# Patient Record
Sex: Male | Born: 1955 | Race: White | Hispanic: No | Marital: Married | Smoking: Current every day smoker
Health system: Southern US, Community
[De-identification: ages and names within clinical notes are randomized; demographics above are authoritative.]

## PROBLEM LIST (undated history)

## (undated) DIAGNOSIS — J439 Emphysema, unspecified: Secondary | ICD-10-CM

## (undated) DIAGNOSIS — E785 Hyperlipidemia, unspecified: Secondary | ICD-10-CM

## (undated) DIAGNOSIS — J309 Allergic rhinitis, unspecified: Secondary | ICD-10-CM

## (undated) HISTORY — DX: Emphysema, unspecified: J43.9

## (undated) HISTORY — PX: LEG SURGERY: SHX1003

## (undated) HISTORY — DX: Hyperlipidemia, unspecified: E78.5

## (undated) HISTORY — DX: Allergic rhinitis, unspecified: J30.9

---

## 2015-06-13 ENCOUNTER — Telehealth: Payer: Self-pay | Admitting: Emergency Medicine

## 2015-06-13 NOTE — Telephone Encounter (Signed)
Spoke with pt. He has been scheduled to see RB on 07/11/15 at 9am. Nothing further was needed.

## 2015-06-13 NOTE — Telephone Encounter (Signed)
new pt Consult  Received: Yesterday    Collene Gobble, MD  Randa Spike, CMA Cc: Collene Gobble, MD           Called by Dr gage at [facility]:  Need Pulm consult visit for Enki Masley [birth year], [phone]   Hx tobacco and COPD, recent PNA, now persistent RUL cavitary lesion by CT and PET done at [facility]. Needs OV and possibly an FOB.     lmtcb x1 for pt to make appointment.

## 2015-07-11 ENCOUNTER — Encounter: Payer: Self-pay | Admitting: Emergency Medicine

## 2015-07-11 ENCOUNTER — Ambulatory Visit (INDEPENDENT_AMBULATORY_CARE_PROVIDER_SITE_OTHER): Payer: BC Managed Care – PPO | Admitting: Emergency Medicine

## 2015-07-11 VITALS — BP 104/80 | HR 86 | Ht 73.0 in | Wt 192.0 lb

## 2015-07-11 DIAGNOSIS — J449 Chronic obstructive pulmonary disease, unspecified: Secondary | ICD-10-CM

## 2015-07-11 DIAGNOSIS — Z720 Tobacco use: Secondary | ICD-10-CM | POA: Insufficient documentation

## 2015-07-11 DIAGNOSIS — J984 Other disorders of lung: Secondary | ICD-10-CM

## 2015-07-11 NOTE — Patient Instructions (Signed)
We will plan for bronchoscopy to biopsy your right lung lesion.  Please continue your Advair and Spiriva as you are using them.  Take albuterol 2 puffs up to every 4 hours if needed for shortness of breath.  Follow with Dr Lamonte Sakai next available after your procedure to review the results.

## 2015-07-11 NOTE — Assessment & Plan Note (Signed)
Continue Spiriva, Advair, pro-air for now.

## 2015-07-11 NOTE — Assessment & Plan Note (Signed)
High suspicion for primary lung malignancy. We will arrange for bronchoscopy with transbronchial biopsies on 07/12/15. Further evaluation depending on these results

## 2015-07-11 NOTE — Assessment & Plan Note (Signed)
Need to encourage tobacco cessation

## 2015-07-11 NOTE — Progress Notes (Signed)
Subjective:    Patient ID: Melvin White, male    DOB: 12-17-1955, 60 y.o.   MRN: [id number]  HPI 60 year old active smoker (45 pack years), with a history of allergic rhinitis, hypertension. Carries a diagnosis of COPD. Currently treated with Spiriva, Advair, pro-air when necessary. He takes loratadine D. He has 2-3 months of progressive SOB and fever. Also some progression of sputum. Was treated for possible PNA, imaged. No hemoptysis. He has been evaluated for an abnormality on CXR and Chest CT at [facility] that I personally reviewed:  CT chest  05/30/15 > Show severe upper lobe predominant emphysema, speculated right upper lobe mass measuring 10 x 3 x 4.4 cm with some areas of necrotic change, also noted was some proximal esophageal thickening, some right lower lobe groundglass opacity of unclear significance. PET 06/12/15>  Significant hypermetabolism of the right upper lobe cavitary mass with an SUV of 11.3. Tiny clustered right mid lobe nodules with mild hypermetabolic activity no evidence of distant disease in the abdomen or bony skeleton.           Review of Systems  Constitutional: Negative for fever and unexpected weight change.  HENT: Negative for congestion, dental problem, ear pain, nosebleeds, postnasal drip, rhinorrhea, sinus pressure, sneezing, sore throat and trouble swallowing.   Eyes: Negative for redness and itching.  Respiratory: Positive for cough. Negative for chest tightness, shortness of breath and wheezing.   Cardiovascular: Negative for palpitations and leg swelling.  Gastrointestinal: Negative for nausea and vomiting.  Genitourinary: Negative for dysuria.  Musculoskeletal: Negative for joint swelling.  Skin: Negative for rash.  Neurological: Negative for headaches.  Hematological: Does not bruise/bleed easily.  Psychiatric/Behavioral: Negative for dysphoric mood. The patient is not nervous/anxious.     Past Medical History  Diagnosis Date  . 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 Hyperlipidemia   . Allergic rhinitis      Family History  Problem Relation Age of Onset  . Lung cancer Father      Social History   Social History  . Marital Status: Married    Spouse Name: N/A  . Number of Children: N/A  . Years of Education: N/A   Occupational History  . Not on file.   Social History Main Topics  . Smoking status: Current Every Day Smoker -- 1.00 packs/day for 45 years    Types: Cigarettes  . Smokeless tobacco: Never Used  . Alcohol Use: No  . Drug Use: No  . Sexual Activity: Not on file   Other Topics Concern  . Not on file   Social History Narrative  . No narrative on file     No Known Allergies   No outpatient prescriptions prior to visit.   No facility-administered medications prior to visit.         Objective:   Physical Exam Filed Vitals:   07/11/15 0901  BP: 104/80  Pulse: 86  Height: 6\' 1"  (1.854 m)  Weight: 192 lb (87.091 kg)  SpO2: 96%   Gen: Pleasant, well-nourished, in no distress,  normal affect  ENT: No lesions,  mouth clear,  oropharynx clear, no postnasal drip  Neck: No JVD, no TMG, no carotid bruits  Lungs: No use of accessory muscles, clear without rales or rhonchi  Cardiovascular: RRR, heart sounds normal, no murmur or gallops, no peripheral edema  Musculoskeletal: No deformities, no cyanosis or clubbing  Neuro: alert, non focal  Skin: Warm, no lesions or rashes  Assessment & Plan:  Cavitating mass in right upper lung lobe High suspicion for primary lung malignancy. We will arrange for bronchoscopy with transbronchial biopsies on 07/12/15. Further evaluation depending on these results  Tobacco abuse Need to encourage tobacco cessation  COPD (chronic obstructive pulmonary disease) (HCC) Continue Spiriva, Advair, pro-air for now.   Baltazar Apo, MD, PhD 07/11/2015, 9:35 AM [facility] [phone] or if no answer [phone]

## 2015-07-12 ENCOUNTER — Ambulatory Visit (HOSPITAL_COMMUNITY): Payer: BC Managed Care – PPO

## 2015-07-12 ENCOUNTER — Ambulatory Visit (HOSPITAL_COMMUNITY)
Admission: RE | Admit: 2015-07-12 | Discharge: 2015-07-12 | Disposition: A | Discharge status: Home / Self Care | Payer: BC Managed Care – PPO | Source: Ambulatory Visit | Attending: Emergency Medicine | Admitting: Emergency Medicine

## 2015-07-12 ENCOUNTER — Encounter (HOSPITAL_COMMUNITY)
Admission: RE | Disposition: A | Discharge status: Home / Self Care | Payer: Self-pay | Source: Ambulatory Visit | Attending: Emergency Medicine

## 2015-07-12 DIAGNOSIS — Z9889 Other specified postprocedural states: Secondary | ICD-10-CM

## 2015-07-12 DIAGNOSIS — D1431 Benign neoplasm of right bronchus and lung: Secondary | ICD-10-CM | POA: Diagnosis not present

## 2015-07-12 DIAGNOSIS — R918 Other nonspecific abnormal finding of lung field: Secondary | ICD-10-CM | POA: Diagnosis present

## 2015-07-12 DIAGNOSIS — J984 Other disorders of lung: Secondary | ICD-10-CM

## 2015-07-12 HISTORY — PX: VIDEO BRONCHOSCOPY: SHX5072

## 2015-07-12 SURGERY — BRONCHOSCOPY, WITH FLUOROSCOPY
Anesthesia: Moderate Sedation | Laterality: Bilateral

## 2015-07-12 MED ORDER — LIDOCAINE HCL (PF) 1 % IJ SOLN
INTRAMUSCULAR | Status: DC | PRN
  Administered 2015-07-12: 6 mL

## 2015-07-12 MED ORDER — MIDAZOLAM HCL 10 MG/2ML IJ SOLN
INTRAMUSCULAR | Status: DC | PRN
  Administered 2015-07-12: 1 mg via INTRAVENOUS
  Administered 2015-07-12 (×3): 2 mg via INTRAVENOUS

## 2015-07-12 MED ORDER — LIDOCAINE HCL 2 % EX GEL
CUTANEOUS | Status: DC | PRN
  Administered 2015-07-12: 1

## 2015-07-12 MED ORDER — MIDAZOLAM HCL 5 MG/ML IJ SOLN
INTRAMUSCULAR | Status: AC
  Filled 2015-07-12: qty 2

## 2015-07-12 MED ORDER — SODIUM CHLORIDE 0.9 % IV SOLN
INTRAVENOUS | Status: DC
  Administered 2015-07-12: 10:00:00 via INTRAVENOUS

## 2015-07-12 MED ORDER — PHENYLEPHRINE HCL 0.25 % NA SOLN
NASAL | Status: DC | PRN
  Administered 2015-07-12: 2 via NASAL

## 2015-07-12 MED ORDER — FENTANYL CITRATE (PF) 100 MCG/2ML IJ SOLN
INTRAMUSCULAR | Status: DC | PRN
  Administered 2015-07-12: 75 ug via INTRAVENOUS
  Administered 2015-07-12: 50 ug via INTRAVENOUS
  Administered 2015-07-12 (×2): 25 ug via INTRAVENOUS

## 2015-07-12 MED ORDER — FENTANYL CITRATE (PF) 100 MCG/2ML IJ SOLN
INTRAMUSCULAR | Status: AC
  Filled 2015-07-12: qty 4

## 2015-07-12 NOTE — Interval H&P Note (Signed)
PCCM Interval Note  Pt with hx tobacco, new RUL spiculated mass that has not resolved with abx, hypermetabolic on PET scan. He presents now for bronchoscopy and biopsies to attempt tissue dx. No new issues reported. All questions regarding the procedure answered.   Filed Vitals:   07/12/15 0935 07/12/15 0940 07/12/15 0945 07/12/15 0950  BP: 151/96 148/77 152/87 139/86  Pulse: 94 91 94 96  Temp:      Resp: 22 18 23 20   Height:      Weight:      SpO2: 94% 95% 95% 96%   Plans: will proceed with video FOB and biopsies this am   Baltazar Apo, MD, PhD 07/12/2015, 9:58 AM [facility] [phone] or if no answer [phone]

## 2015-07-12 NOTE — H&P (View-Only) (Signed)
Subjective:    Patient ID: Melvin White, male    DOB: May 02, 1955, 60 y.o.   MRN: [id number]  HPI 60 year old active smoker (45 pack years), with a history of allergic rhinitis, hypertension. Carries a diagnosis of COPD. Currently treated with Spiriva, Advair, pro-air when necessary. He takes loratadine D. He has 2-3 months of progressive SOB and fever. Also some progression of sputum. Was treated for possible PNA, imaged. No hemoptysis. He has been evaluated for an abnormality on CXR and Chest CT at [facility] that I personally reviewed:  CT chest  05/30/15 > Show severe upper lobe predominant emphysema, speculated right upper lobe mass measuring 10 x 3 x 4.4 cm with some areas of necrotic change, also noted was some proximal esophageal thickening, some right lower lobe groundglass opacity of unclear significance. PET 06/12/15>  Significant hypermetabolism of the right upper lobe cavitary mass with an SUV of 11.3. Tiny clustered right mid lobe nodules with mild hypermetabolic activity no evidence of distant disease in the abdomen or bony skeleton.           Review of Systems  Constitutional: Negative for fever and unexpected weight change.  HENT: Negative for congestion, dental problem, ear pain, nosebleeds, postnasal drip, rhinorrhea, sinus pressure, sneezing, sore throat and trouble swallowing.   Eyes: Negative for redness and itching.  Respiratory: Positive for cough. Negative for chest tightness, shortness of breath and wheezing.   Cardiovascular: Negative for palpitations and leg swelling.  Gastrointestinal: Negative for nausea and vomiting.  Genitourinary: Negative for dysuria.  Musculoskeletal: Negative for joint swelling.  Skin: Negative for rash.  Neurological: Negative for headaches.  Hematological: Does not bruise/bleed easily.  Psychiatric/Behavioral: Negative for dysphoric mood. The patient is not nervous/anxious.     Past Medical History  Diagnosis Date  . 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 Hyperlipidemia   . Allergic rhinitis      Family History  Problem Relation Age of Onset  . Lung cancer Father      Social History   Social History  . Marital Status: Married    Spouse Name: N/A  . Number of Children: N/A  . Years of Education: N/A   Occupational History  . Not on file.   Social History Main Topics  . Smoking status: Current Every Day Smoker -- 1.00 packs/day for 45 years    Types: Cigarettes  . Smokeless tobacco: Never Used  . Alcohol Use: No  . Drug Use: No  . Sexual Activity: Not on file   Other Topics Concern  . Not on file   Social History Narrative  . No narrative on file     No Known Allergies   No outpatient prescriptions prior to visit.   No facility-administered medications prior to visit.         Objective:   Physical Exam Filed Vitals:   07/11/15 0901  BP: 104/80  Pulse: 86  Height: 6\' 1"  (1.854 m)  Weight: 192 lb (87.091 kg)  SpO2: 96%   Gen: Pleasant, well-nourished, in no distress,  normal affect  ENT: No lesions,  mouth clear,  oropharynx clear, no postnasal drip  Neck: No JVD, no TMG, no carotid bruits  Lungs: No use of accessory muscles, clear without rales or rhonchi  Cardiovascular: RRR, heart sounds normal, no murmur or gallops, no peripheral edema  Musculoskeletal: No deformities, no cyanosis or clubbing  Neuro: alert, non focal  Skin: Warm, no lesions or rashes  Assessment & Plan:  Cavitating mass in right upper lung lobe High suspicion for primary lung malignancy. We will arrange for bronchoscopy with transbronchial biopsies on 07/12/15. Further evaluation depending on these results  Tobacco abuse Need to encourage tobacco cessation  COPD (chronic obstructive pulmonary disease) (HCC) Continue Spiriva, Advair, pro-air for now.   Baltazar Apo, MD, PhD 07/11/2015, 9:35 AM [facility] [phone] or if no answer [phone]

## 2015-07-12 NOTE — Discharge Instructions (Signed)
Flexible Bronchoscopy, Care After °These instructions give you information on caring for yourself after your procedure. Your doctor may also give you more specific instructions. Call your doctor if you have any problems or questions after your procedure. °HOME CARE °· Do not eat or drink anything for 2 hours after your procedure. If you try to eat or drink before the medicine wears off, food or drink could go into your lungs. You could also burn yourself. °· After 2 hours have passed and when you can cough and gag normally, you may eat soft food and drink liquids slowly. °· The day after the test, you may eat your normal diet. °· You may do your normal activities. °· Keep all doctor visits. °GET HELP RIGHT AWAY IF: °· You get more and more short of breath. °· You get light-headed. °· You feel like you are going to pass out (faint). °· You have chest pain. °· You have new problems that worry you. °· You cough up more than a little blood. °· You cough up more blood than before. °MAKE SURE YOU: °· Understand these instructions. °· Will watch your condition. °· Will get help right away if you are not doing well or get worse. °  °This information is not intended to replace advice given to you by your health care provider. Make sure you discuss any questions you have with your health care provider. ° °Please call our office for any questions or concerns. [phone].  °  °Document Released: 10/26/2008 Document Revised: 01/03/2013 Document Reviewed: 09/02/2012 °Elsevier Interactive Patient Education ©2016 [employer] Inc. ° °

## 2015-07-12 NOTE — Op Note (Signed)
[facility] Cardiopulmonary Patient Name: Melvin White Date: 07/12/2015 MRN: [id number] Attending MD: Collene Gobble , MD Date of Birth: 1955-08-06 CSN: Finalized Age: 60 Admit Type: Outpatient Gender: Male Procedure:            Bronchoscopy Indications:          Right upper lobe mass Providers:            Collene Gobble, MD, Tammie Readling RRT,RCP, Doris Cheadle RRT,RCP Referring MD:          Medicines:            Fentanyl 175 mcg IV, Midazolam 7 mg IV, Lidocaine 1%                        applied to cords 6 mL, Lidocaine 1% applied to the                        tracheobronchial tree 15 mL Complications:        No immediate complications Estimated Blood Loss: Estimated blood loss was minimal. Procedure:            Pre-Anesthesia Assessment:                       - A History and Physical has been performed. Patient                        meds and allergies have been reviewed. The risks and                        benefits of the procedure and the sedation options and                        risks were discussed with the patient. All questions                        were answered and informed consent was obtained.                        Patient identification and proposed procedure were                        verified prior to the procedure by the physician in the                        procedure room. Mental Status Examination: normal.                        Airway Examination: normal oropharyngeal airway.                        Respiratory Examination: poor air movement. CV                        Examination: normal. ASA Grade Assessment: II - A                        patient with mild systemic disease. After reviewing the  risks and benefits, the patient was deemed in                        satisfactory condition to undergo the procedure. The                        anesthesia plan was to use moderate sedation /                         analgesia (conscious sedation). Immediately prior to                        administration of medications, the patient was                        re-assessed for adequacy to receive sedatives. The                        heart rate, respiratory rate, oxygen saturations, blood                        pressure, adequacy of pulmonary ventilation, and                        response to care were monitored throughout the                        procedure. The physical status of the patient was                        re-assessed after the procedure.                       After obtaining informed consent, the bronchoscope was                        passed under direct vision. Throughout the procedure,                        the patient's blood pressure, pulse, and oxygen                        saturations were monitored continuously. the [id number]                        [id number] scope was introduced through the right nostril                        and advanced to the tracheobronchial tree of both                        lungs. The patient tolerated the procedure well. Scope In: 10:16:17 AM Scope Out: 10:40:00 AM Findings:      The nasopharynx/oropharynx appears normal. The larynx appears normal.       The vocal cords appear normal. The subglottic space is normal. The       trachea is of normal caliber. The carina is sharp. The tracheobronchial       tree was examined to at least the first subsegmental level. Bronchial       mucosa and anatomy are normal; there  are no endobronchial lesions, and       no secretions.      Transbronchial biopsies of a mass were performed in the apical segment       of the right upper lobe using forceps and sent for histopathology       examination. The procedure was guided by fluoroscopy. Six biopsy passes       were performed. Six biopsy samples were obtained.      Bronchoalveolar lavage was performed in the lung and sent for routine        cytology, aerobic culture, AFB analysis & culture, Nocardia, fungal       analysis and Actinomyces analysis. 60 mL of fluid were instilled. 10 mL       were returned. The return was blood-tinged. There were no mucoid plugs       in the return fluid.      Fluoroscopically guided transbronchial brushings of a mass were obtained       in the apical segment of the right upper lobe with a cytology brush and       sent for routine cytology. Five samples were obtained. Impression:           - Right upper lobe mass                       - The examination was normal.                       - Transbronchial lung biopsies were performed.                       - Bronchoalveolar lavage was performed.                       - Fluoroscopically guided transbronchial brushings were                        obtained. Moderate Sedation:      Moderate (conscious) sedation was personally administered by the       endoscopist. The following parameters were monitored: oxygen saturation,       heart rate, blood pressure, respiratory rate, adequacy of pulmonary       ventilation and response to care. Total physician intraservice time was       28 minutes. Recommendation:       - Await BAL, biopsy and brushing results.                       - Follow up with bronchoscopist as previously scheduled. Procedure Code(s):    --- Professional ---                       [phone], Bronchoscopy, rigid or flexible, including                        fluoroscopic guidance, when performed; with                        transbronchial lung biopsy(s), single lobe                       [id number], Bronchoscopy, rigid or flexible, including  fluoroscopic guidance, when performed; with brushing or                        protected brushings                       [id number], Moderate sedation services provided by the same                        physician or other qualified health care professional                        performing  the diagnostic or therapeutic service that                        the sedation supports, requiring the presence of an                        independent trained observer to assist in the                        monitoring of the patient's level of consciousness and                        physiological status; initial 15 minutes of                        intraservice time, patient age 53 years or older                       [phone], Moderate sedation services; each additional 15                        minutes intraservice time Diagnosis Code(s):    --- Professional ---                       R91.8, Other nonspecific abnormal finding of lung field CPT copyright 2016 American Medical Association. All rights reserved. The codes documented in this report are preliminary and upon coder review may  be revised to meet current compliance requirements. Collene Gobble, MD Collene Gobble, MD 07/12/2015 11:37:19 AM Number of Addenda: 0

## 2015-07-12 NOTE — Progress Notes (Signed)
Video bronchoscopy with washing intervention, biopsy intervention, brushing intervention. All vitals good thru out procedure. Report given to wife about drinking. Eating. Tylenol to be given.

## 2015-07-13 LAB — ACID FAST SMEAR (AFB, MYCOBACTERIA): Acid Fast Smear: NEGATIVE

## 2015-07-13 LAB — ACID FAST SMEAR (AFB)

## 2015-07-14 ENCOUNTER — Encounter (HOSPITAL_COMMUNITY): Payer: Self-pay | Admitting: Emergency Medicine

## 2015-07-14 LAB — CULTURE, BAL-QUANTITATIVE: CULTURE: NORMAL — AB

## 2015-07-14 LAB — CULTURE, BAL-QUANTITATIVE W GRAM STAIN

## 2015-07-15 ENCOUNTER — Telehealth: Payer: Self-pay | Admitting: Emergency Medicine

## 2015-07-15 NOTE — Telephone Encounter (Signed)
Pt states that he has not started his Aspirin yet -- pt has not taken this since before procedure. Pt has only taken Spiriva, Advair, Proair and Tylenol PRN since procedure.  Please advise Dr Melvyn Novas thanks.

## 2015-07-15 NOTE — Telephone Encounter (Signed)
Called made pt aware of recs. Nothing further needed 

## 2015-07-15 NOTE — Telephone Encounter (Signed)
Hold aspirin until no longer bleeding at all - this is not unusual

## 2015-07-15 NOTE — Telephone Encounter (Signed)
Then he's doing all that he needs for now - call if getting worse pending bx results and we'll go from there

## 2015-07-15 NOTE — Telephone Encounter (Signed)
Spoke with pt, since bronch on 07/12/15 has had a prod cough- mucus is clear with streaks of dark blood, with an occasional "glob" of blood about the size of a thumbnail coming up.  Pt states that the hemoptysis is less frequent than it was immediately after the procedure, but pt is concerned with how long he should be coughing up blood.  Denies fever, chest pain.  Pt uses [facility].    Sending to DOD as RB is on vacation.   MW please advise.  Thanks!   Patient Instructions       We will plan for bronchoscopy to biopsy your right lung lesion.   Please continue your Advair and Spiriva as you are using them.   Take albuterol 2 puffs up to every 4 hours if needed for shortness of breath.  Follow with Dr Lamonte Sakai next available after your procedure to review the results.

## 2015-07-18 ENCOUNTER — Telehealth: Payer: Self-pay | Admitting: Emergency Medicine

## 2015-07-18 DIAGNOSIS — R918 Other nonspecific abnormal finding of lung field: Secondary | ICD-10-CM

## 2015-07-18 NOTE — Telephone Encounter (Signed)
Received a call from [place]. He had me look at the pt's bronch cytology and pathology reports. Bronch was benign. Per RB >> pt needs to have a CT guided biopsy with IR.  Pt is aware of results and additional testing he will need. Order has been placed. Nothing further was needed.

## 2015-07-31 ENCOUNTER — Telehealth: Payer: Self-pay | Admitting: Emergency Medicine

## 2015-07-31 NOTE — Telephone Encounter (Signed)
Called pt to see whether he has been set up for IR procedure to assess his lung mass. No answer, left message. Will try him again or he will call us.

## 2015-08-06 NOTE — Telephone Encounter (Signed)
Please call Melvin White to see if / when he is to have biopsy performed. Thanks

## 2015-08-07 NOTE — Telephone Encounter (Signed)
lmtcb x1 for pt. 

## 2015-08-08 NOTE — Telephone Encounter (Signed)
lmtcb x2 for pt.  I spoke with [facility] about this order. She states that there is documentation that Dr. Almedia Balls tried to reach RB about this pt. That is why this has not been scheduled yet.

## 2015-08-08 NOTE — Telephone Encounter (Signed)
Please have the patient make an OV to see me asap and we will discuss the results and whether to schedule another biopsy at this time.

## 2015-08-08 NOTE — Telephone Encounter (Signed)
Called spoke with patient to discuss RB's recommendations Pt is not scheduled for another biopsy at this time but is already scheduled for ov for the next available appt on 8.8.17 Will sign and forward to RB as FYI

## 2015-08-14 LAB — FUNGUS CULTURE RESULT

## 2015-08-14 LAB — FUNGUS CULTURE WITH STAIN

## 2015-08-14 LAB — FUNGAL ORGANISM REFLEX

## 2015-08-20 ENCOUNTER — Ambulatory Visit (INDEPENDENT_AMBULATORY_CARE_PROVIDER_SITE_OTHER): Payer: BC Managed Care – PPO | Admitting: Emergency Medicine

## 2015-08-20 ENCOUNTER — Encounter: Payer: Self-pay | Admitting: Emergency Medicine

## 2015-08-20 ENCOUNTER — Encounter (INDEPENDENT_AMBULATORY_CARE_PROVIDER_SITE_OTHER): Payer: Self-pay

## 2015-08-20 VITALS — BP 110/62 | HR 84 | Ht 72.0 in | Wt 196.0 lb

## 2015-08-20 DIAGNOSIS — R918 Other nonspecific abnormal finding of lung field: Secondary | ICD-10-CM

## 2015-08-20 DIAGNOSIS — J449 Chronic obstructive pulmonary disease, unspecified: Secondary | ICD-10-CM

## 2015-08-20 DIAGNOSIS — J984 Other disorders of lung: Secondary | ICD-10-CM | POA: Diagnosis not present

## 2015-08-20 NOTE — Progress Notes (Signed)
Subjective:    Patient ID: Melvin White, male    DOB: 1955-09-24, 60 y.o.   MRN: [id number]  HPI 60 year old active smoker (45 pack years), with a history of allergic rhinitis, hypertension. Carries a diagnosis of COPD. Currently treated with Spiriva, Advair, pro-air when necessary. He takes loratadine D. He has 2-3 months of progressive SOB and fever. Also some progression of sputum. Was treated for possible PNA, imaged. No hemoptysis. He has been evaluated for an abnormality on CXR and Chest CT at [facility] that I personally reviewed:  CT chest  05/30/15 > Show severe upper lobe predominant emphysema, speculated right upper lobe mass measuring 10 x 3 x 4.4 cm with some areas of necrotic change, also noted was some proximal esophageal thickening, some right lower lobe groundglass opacity of unclear significance. PET 06/12/15>  Significant hypermetabolism of the right upper lobe cavitary mass with an SUV of 11.3. Tiny clustered right mid lobe nodules with mild hypermetabolic activity no evidence of distant disease in the abdomen or bony skeleton.   ROV 08/20/15 -- Follow-up visit for history of tobacco use, COPD and a spiculated right upper lobe mass with some areas of necrotic change, hypermetabolic on PET scan from 06/12/15. He underwent bronchoscopy on 07/12/15. Cytology and pathology were negative. Given my index of suspicion I referred him for needle biopsy in interventional radiology. This apparently has not yet been done.  He is actually doing very well. Denies any change in dyspnea or functional capacity. No weight loss.      Review of Systems  Constitutional: Negative for fever and unexpected weight change.  HENT: Negative for congestion, dental problem, ear pain, nosebleeds, postnasal drip, rhinorrhea, sinus pressure, sneezing, sore throat and trouble swallowing.   Eyes: Negative for redness and itching.  Respiratory: Positive for cough. Negative for chest tightness, shortness of breath and  wheezing.   Cardiovascular: Negative for palpitations and leg swelling.  Gastrointestinal: Negative for nausea and vomiting.  Genitourinary: Negative for dysuria.  Musculoskeletal: Negative for joint swelling.  Skin: Negative for rash.  Neurological: Negative for headaches.  Hematological: Does not bruise/bleed easily.  Psychiatric/Behavioral: Negative for dysphoric mood. The patient is not nervous/anxious.     Past Medical History:  Diagnosis Date  . Allergic rhinitis   . Emphysema of lung ([place])   . Hyperlipidemia      Family History  Problem Relation Age of Onset  . Lung cancer Father      Social History   Social History  . Marital status: Married    Spouse name: N/A  . Number of children: N/A  . Years of education: N/A   Occupational History  . Not on file.   Social History Main Topics  . Smoking status: Current Every Day Smoker    Packs/day: 1.00    Years: 45.00    Types: Cigarettes  . Smokeless tobacco: Never Used  . Alcohol use No  . Drug use: No  . Sexual activity: Not on file   Other Topics Concern  . Not on file   Social History Narrative  . No narrative on file     No Known Allergies   Outpatient Medications Prior to Visit  Medication Sig Dispense Refill  . albuterol (PROAIR HFA) 108 (90 Base) MCG/ACT inhaler Inhale 2 puffs into the lungs every 6 (six) hours as needed for wheezing or shortness of breath.    Marland Kitchen aspirin 81 MG tablet Take 81 mg by mouth daily.    . Cholecalciferol (VITAMIN D3)  5000 units CAPS Take 1 capsule by mouth daily.    . Coenzyme Q10 (COQ10) 200 MG CAPS Take 1 capsule by mouth daily.    . fluticasone-salmeterol (ADVAIR HFA) 230-21 MCG/ACT inhaler Inhale 2 puffs into the lungs 2 (two) times daily.    Marland Kitchen loratadine-pseudoephedrine (CLARITIN-D 24 HOUR) 10-240 MG 24 hr tablet Take 1 tablet by mouth daily.    . Omega-3 Fatty Acids (FISH OIL) 1000 MG CAPS Take 1 capsule by mouth daily.    . rosuvastatin (CRESTOR) 5 MG tablet Take 5  mg by mouth daily.    . tadalafil (CIALIS) 5 MG tablet Take 5 mg by mouth daily as needed for erectile dysfunction.    . Testosterone (AXIRON) 30 MG/ACT SOLN Place 1 application onto the skin daily.    Marland Kitchen tiotropium (SPIRIVA) 18 MCG inhalation capsule Place 18 mcg into inhaler and inhale daily.    . vitamin B-12 (CYANOCOBALAMIN) 1000 MCG tablet Take 1,000 mcg by mouth daily.     No facility-administered medications prior to visit.          Objective:   Physical Exam Vitals:   08/20/15 1433 08/20/15 1434  BP:  110/62  Pulse:  84  SpO2:  98%  Weight: 196 lb (88.9 kg)   Height: 6' (1.829 m)    Gen: Pleasant, well-nourished, in no distress,  normal affect  ENT: No lesions,  mouth clear,  oropharynx clear, no postnasal drip  Neck: No JVD, no TMG, no carotid bruits  Lungs: No use of accessory muscles, clear without rales or rhonchi  Cardiovascular: RRR, heart sounds normal, no murmur or gallops, no peripheral edema  Musculoskeletal: No deformities, no cyanosis or clubbing  Neuro: alert, non focal  Skin: Warm, no lesions or rashes      Assessment & Plan:  Cavitating mass in right upper lung lobe At this point I believe we can repeat his CT scan of the chest, assess for interval change. It is possible that the lesion has shrunk consistent with an infectious process. If so then this is reassuring and supports our bronchoscopy diagnosis. If not then we will decide which modality is best for repeat biopsy either navigational bronchoscopy or interventional radiology.  COPD (chronic obstructive pulmonary disease) ([place]) Continue same medical regimen  Baltazar Apo, MD, PhD 08/20/2015, 3:05 PM [facility] [phone] or if no answer [phone]

## 2015-08-20 NOTE — Assessment & Plan Note (Signed)
At this point I believe we can repeat his CT scan of the chest, assess for interval change. It is possible that the lesion has shrunk consistent with an infectious process. If so then this is reassuring and supports our bronchoscopy diagnosis. If not then we will decide which modality is best for repeat biopsy either navigational bronchoscopy or interventional radiology.

## 2015-08-20 NOTE — Patient Instructions (Addendum)
We will repeat your Ct scan of the chest to compare with your prior scans.  Based on your scan we will decide whether to arrange for another procedure to evaluate your right upper lobe.  Follow up with Dr. Lamonte Sakai at his next available appointment.

## 2015-08-20 NOTE — Assessment & Plan Note (Signed)
Continue same medical regimen

## 2015-08-26 LAB — ACID FAST CULTURE WITH REFLEXED SENSITIVITIES (MYCOBACTERIA)

## 2015-08-26 LAB — ACID FAST CULTURE WITH REFLEXED SENSITIVITIES: ACID FAST CULTURE - AFSCU3: NEGATIVE

## 2015-08-27 ENCOUNTER — Ambulatory Visit (INDEPENDENT_AMBULATORY_CARE_PROVIDER_SITE_OTHER)
Admission: RE | Admit: 2015-08-27 | Discharge: 2015-08-27 | Disposition: A | Discharge status: Home / Self Care | Payer: BC Managed Care – PPO | Source: Ambulatory Visit | Attending: Emergency Medicine | Admitting: Emergency Medicine

## 2015-08-27 DIAGNOSIS — R918 Other nonspecific abnormal finding of lung field: Secondary | ICD-10-CM | POA: Diagnosis not present

## 2015-09-17 ENCOUNTER — Encounter: Payer: Self-pay | Admitting: Emergency Medicine

## 2015-09-17 ENCOUNTER — Ambulatory Visit (INDEPENDENT_AMBULATORY_CARE_PROVIDER_SITE_OTHER): Payer: BC Managed Care – PPO | Admitting: Emergency Medicine

## 2015-09-17 DIAGNOSIS — Z720 Tobacco use: Secondary | ICD-10-CM | POA: Diagnosis not present

## 2015-09-17 DIAGNOSIS — J984 Other disorders of lung: Secondary | ICD-10-CM

## 2015-09-17 DIAGNOSIS — J449 Chronic obstructive pulmonary disease, unspecified: Secondary | ICD-10-CM

## 2015-09-17 MED ORDER — AMOXICILLIN-POT CLAVULANATE 875-125 MG PO TABS: 1.0000 | ORAL_TABLET | Freq: Two times a day (BID) | ORAL | 0 refills | Status: DC

## 2015-09-17 NOTE — Assessment & Plan Note (Signed)
Based on the migratory nature of the RUL changes I am more suspicious of an infectious process, possible lung abscess at this time. Certainly we must also consider fungal disease, nocardia or actinomyces. We discussed repeating his bronchoscopy. In the end we have decided to treat with antibiotics for possible lung abscess for 1 month and then repeat a CT scan to look for interval change. If the right upper lobe area has not cleared or if he worsens clinically then I believe he needs bronchoscopy with culture data. Even if the area is a lung abscess he may still need surgical resection depending on his clinical response.

## 2015-09-17 NOTE — Assessment & Plan Note (Signed)
Discussed cessation today. 

## 2015-09-17 NOTE — Assessment & Plan Note (Signed)
Continue current bronchodilators as ordered 

## 2015-09-17 NOTE — Patient Instructions (Signed)
Please take Augmentin 875mg  twice a day for 1 months.  We will repeat your CT scan of the chest without contrast in 6 - 8 weeks to follow suspected lung abscess.  Depending on the response of the right upper lobe changes to antibiotics we may decide to pursue another bronchoscopy or other procedure to further evaluate. Follow with Dr Lamonte Sakai in 6 - 8 weeks or sooner if you have any problems

## 2015-09-17 NOTE — Addendum Note (Signed)
Addended by: Mathis Dad on: 09/17/2015 12:14 PM   Modules accepted: Orders

## 2015-09-17 NOTE — Progress Notes (Signed)
Subjective:    Patient ID: Melvin White, male    DOB: [birth year], 60 y.o.   MRN: [id number]  HPI 60 year old active smoker (45 pack years), with a history of allergic rhinitis, hypertension. Carries a diagnosis of COPD. Currently treated with Spiriva, Advair, pro-air when necessary. He takes loratadine D. He has 2-3 months of progressive SOB and fever. Also some progression of sputum. Was treated for possible PNA, imaged. No hemoptysis. He has been evaluated for an abnormality on CXR and Chest CT at [facility] that I personally reviewed:  CT chest  05/30/15 > Show severe upper lobe predominant emphysema, speculated right upper lobe mass measuring 10 x 3 x 4.4 cm with some areas of necrotic change, also noted was some proximal esophageal thickening, some right lower lobe groundglass opacity of unclear significance. PET 06/12/15>  Significant hypermetabolism of the right upper lobe cavitary mass with an SUV of 11.3. Tiny clustered right mid lobe nodules with mild hypermetabolic activity no evidence of distant disease in the abdomen or bony skeleton.   ROV 08/20/15 -- Follow-up visit for history of tobacco use, COPD and a spiculated right upper lobe mass with some areas of necrotic change, hypermetabolic on PET scan from 06/12/15. He underwent bronchoscopy on 07/12/15. Cytology and pathology were negative. Given my index of suspicion I referred him for needle biopsy in interventional radiology. This apparently has not yet been done.  He is actually doing very well. Denies any change in dyspnea or functional capacity. No weight loss.     ROV 09/17/15 -- Patient has a history of tobacco use, right upper lobe spiculated mass. Cytology and pathology were benign on 07/12/15 bronchoscopy. He underwent repeat CT scan of the chest on 08/27/15 that I have personally reviewed And which shows Some inferior clearing and decrease in size but with some progressive partially cavitary process at the right apex appears to be most  consistent with infectious process.     Review of Systems  Constitutional: Negative for fever and unexpected weight change.  HENT: Negative for congestion, dental problem, ear pain, nosebleeds, postnasal drip, rhinorrhea, sinus pressure, sneezing, sore throat and trouble swallowing.   Eyes: Negative for redness and itching.  Respiratory: Positive for cough. Negative for chest tightness, shortness of breath and wheezing.   Cardiovascular: Negative for palpitations and leg swelling.  Gastrointestinal: Negative for nausea and vomiting.  Genitourinary: Negative for dysuria.  Musculoskeletal: Negative for joint swelling.  Skin: Negative for rash.  Neurological: Negative for headaches.  Hematological: Does not bruise/bleed easily.  Psychiatric/Behavioral: Negative for dysphoric mood. The patient is not nervous/anxious.     Past Medical History:  Diagnosis Date  . Allergic rhinitis   . Emphysema of lung ([place])   . Hyperlipidemia      Family History  Problem Relation Age of Onset  . Lung cancer Father      Social History   Social History  . Marital status: Married    Spouse name: N/A  . Number of children: N/A  . Years of education: N/A   Occupational History  . Not on file.   Social History Main Topics  . Smoking status: Current Every Day Smoker    Packs/day: 1.00    Years: 45.00    Types: Cigarettes  . Smokeless tobacco: Never Used  . Alcohol use No  . Drug use: No  . Sexual activity: Not on file   Other Topics Concern  . Not on file   Social History Narrative  . No narrative  on file     No Known Allergies   Outpatient Medications Prior to Visit  Medication Sig Dispense Refill  . albuterol (PROAIR HFA) 108 (90 Base) MCG/ACT inhaler Inhale 2 puffs into the lungs every 6 (six) hours as needed for wheezing or shortness of breath.    Marland Kitchen aspirin 81 MG tablet Take 81 mg by mouth daily.    . Cholecalciferol (VITAMIN D3) 5000 units CAPS Take 1 capsule by mouth daily.     . Coenzyme Q10 (COQ10) 200 MG CAPS Take 1 capsule by mouth daily.    . fluticasone-salmeterol (ADVAIR HFA) 230-21 MCG/ACT inhaler Inhale 2 puffs into the lungs 2 (two) times daily.    Marland Kitchen loratadine-pseudoephedrine (CLARITIN-D 24 HOUR) 10-240 MG 24 hr tablet Take 1 tablet by mouth daily.    . Omega-3 Fatty Acids (FISH OIL) 1000 MG CAPS Take 1 capsule by mouth daily.    . rosuvastatin (CRESTOR) 5 MG tablet Take 5 mg by mouth daily.    . tadalafil (CIALIS) 5 MG tablet Take 5 mg by mouth daily as needed for erectile dysfunction.    . Testosterone (AXIRON) 30 MG/ACT SOLN Place 1 application onto the skin daily.    Marland Kitchen tiotropium (SPIRIVA) 18 MCG inhalation capsule Place 18 mcg into inhaler and inhale daily.    . vitamin B-12 (CYANOCOBALAMIN) 1000 MCG tablet Take 1,000 mcg by mouth daily.     No facility-administered medications prior to visit.          Objective:   Physical Exam Vitals:   09/17/15 1123  BP: 126/80  Pulse: 100  SpO2: 96%  Weight: 197 lb (89.4 kg)  Height: 6' (1.829 m)   Gen: Pleasant, well-nourished, in no distress,  normal affect  ENT: No lesions,  mouth clear,  oropharynx clear, no postnasal drip  Neck: No JVD, no TMG, no carotid bruits  Lungs: No use of accessory muscles, clear without rales or rhonchi  Cardiovascular: RRR, heart sounds normal, no murmur or gallops, no peripheral edema  Musculoskeletal: No deformities, no cyanosis or clubbing  Neuro: alert, non focal  Skin: Warm, no lesions or rashes      Assessment & Plan:  COPD (chronic obstructive pulmonary disease) ([facility]) Continue current bronchodilators as ordered  Tobacco abuse Discussed cessation today  Cavitating mass in right upper lung lobe Based on the migratory nature of the RUL changes I am more suspicious of an infectious process, possible lung abscess at this time. Certainly we must also consider fungal disease, nocardia or actinomyces. We discussed repeating his bronchoscopy. In the end  we have decided to treat with antibiotics for possible lung abscess for 1 month and then repeat a CT scan to look for interval change. If the right upper lobe area has not cleared or if he worsens clinically then I believe he needs bronchoscopy with culture data. Even if the area is a lung abscess he may still need surgical resection depending on his clinical response.  Baltazar Apo, MD, PhD 09/17/2015, 12:06 PM [facility] [phone] or if no answer [phone]

## 2015-10-29 ENCOUNTER — Ambulatory Visit (INDEPENDENT_AMBULATORY_CARE_PROVIDER_SITE_OTHER)
Admission: RE | Admit: 2015-10-29 | Discharge: 2015-10-29 | Disposition: A | Discharge status: Home / Self Care | Payer: BC Managed Care – PPO | Source: Ambulatory Visit | Attending: Emergency Medicine | Admitting: Emergency Medicine

## 2015-10-29 DIAGNOSIS — Z720 Tobacco use: Secondary | ICD-10-CM | POA: Diagnosis not present

## 2015-10-29 DIAGNOSIS — J984 Other disorders of lung: Secondary | ICD-10-CM

## 2015-10-29 DIAGNOSIS — J449 Chronic obstructive pulmonary disease, unspecified: Secondary | ICD-10-CM

## 2015-10-30 ENCOUNTER — Telehealth: Payer: Self-pay | Admitting: Emergency Medicine

## 2015-10-30 NOTE — Progress Notes (Signed)
Pt aware of results. Pt voiced understanding and had no further questions. Nothing further needed.  

## 2015-10-30 NOTE — Telephone Encounter (Signed)
Notes Recorded by Collene Gobble, MD on 10/30/2015 at 1:24 PM EDT Please let the patient that the RUL opacity on his CT scan has continued to shrink based on this scan. We will need to follow up to decide whether he needs longer treatment to continue the improvement versus follow it off medication.   Pt aware of Ct results. Pt voiced understanding and had no further questions. Nothing further needed.

## 2015-10-31 ENCOUNTER — Encounter: Payer: Self-pay | Admitting: Emergency Medicine

## 2015-10-31 ENCOUNTER — Ambulatory Visit (INDEPENDENT_AMBULATORY_CARE_PROVIDER_SITE_OTHER): Payer: BC Managed Care – PPO | Admitting: Emergency Medicine

## 2015-10-31 VITALS — BP 104/64 | HR 96 | Ht 72.0 in | Wt 194.6 lb

## 2015-10-31 DIAGNOSIS — R918 Other nonspecific abnormal finding of lung field: Secondary | ICD-10-CM

## 2015-10-31 MED ORDER — AMOXICILLIN-POT CLAVULANATE 875-125 MG PO TABS: 1.0000 | ORAL_TABLET | Freq: Two times a day (BID) | ORAL | 0 refills | Status: DC

## 2015-10-31 NOTE — Progress Notes (Signed)
Subjective:    Patient ID: Melvin White, male    DOB: 1955/03/11, 60 y.o.   MRN: [id number]  HPI 60 year old active smoker (45 pack years), with a history of allergic rhinitis, hypertension. Carries a diagnosis of COPD. Currently treated with Spiriva, Advair, pro-air when necessary. He takes loratadine D. He has 2-3 months of progressive SOB and fever. Also some progression of sputum. Was treated for possible PNA, imaged. No hemoptysis. He has been evaluated for an abnormality on CXR and Chest CT at [facility] that I personally reviewed:  CT chest  05/30/15 > Show severe upper lobe predominant emphysema, speculated right upper lobe mass measuring 10 x 3 x 4.4 cm with some areas of necrotic change, also noted was some proximal esophageal thickening, some right lower lobe groundglass opacity of unclear significance. PET 06/12/15>  Significant hypermetabolism of the right upper lobe cavitary mass with an SUV of 11.3. Tiny clustered right mid lobe nodules with mild hypermetabolic activity no evidence of distant disease in the abdomen or bony skeleton.   ROV 08/20/15 -- Follow-up visit for history of tobacco use, COPD and a spiculated right upper lobe mass with some areas of necrotic change, hypermetabolic on PET scan from 06/12/15. He underwent bronchoscopy on 07/12/15. Cytology and pathology were negative. Given my index of suspicion I referred him for needle biopsy in interventional radiology. This apparently has not yet been done.  He is actually doing very well. Denies any change in dyspnea or functional capacity. No weight loss.     ROV 09/17/15 -- Patient has a history of tobacco use, right upper lobe spiculated mass. Cytology and pathology were benign on 07/12/15 bronchoscopy. He underwent repeat CT scan of the chest on 08/27/15 that I have personally reviewed And which shows Some inferior clearing and decrease in size but with some progressive partially cavitary process at the right apex appears to be most  consistent with infectious process.    ROV 10/31/15 -- is a follow-up visit for a right upper lobe cavitary and spiculated lesion that we have evaluated by bronchoscopy and have followed by serial CT scans of the chest. He has been treated with an month of Augmentin beginning 09/17/15. We then repeated his CT scan of the chest on 10/29/15 and I have reviewed > shows continued decrease in size of his dominant right upper lobe cavitary focus. His breathing improved on the augmentin. His cough has decreased significantly.    Review of Systems  Constitutional: Negative for fever and unexpected weight change.  HENT: Negative for congestion, dental problem, ear pain, nosebleeds, postnasal drip, rhinorrhea, sinus pressure, sneezing, sore throat and trouble swallowing.   Eyes: Negative for redness and itching.  Respiratory: Positive for cough. Negative for chest tightness, shortness of breath and wheezing.   Cardiovascular: Negative for palpitations and leg swelling.  Gastrointestinal: Negative for nausea and vomiting.  Genitourinary: Negative for dysuria.  Musculoskeletal: Negative for joint swelling.  Skin: Negative for rash.  Neurological: Negative for headaches.  Hematological: Does not bruise/bleed easily.  Psychiatric/Behavioral: Negative for dysphoric mood. The patient is not nervous/anxious.     Past Medical History:  Diagnosis Date  . Allergic rhinitis   . Emphysema of lung ([place])   . Hyperlipidemia      Family History  Problem Relation Age of Onset  . Lung cancer Father      Social History   Social History  . Marital status: Married    Spouse name: N/A  . Number of children: N/A  .  Years of education: N/A   Occupational History  . Not on file.   Social History Main Topics  . Smoking status: Current Every Day Smoker    Packs/day: 1.00    Years: 45.00    Types: Cigarettes  . Smokeless tobacco: Never Used  . Alcohol use No  . Drug use: No  . Sexual activity: Not on file    Other Topics Concern  . Not on file   Social History Narrative  . No narrative on file     No Known Allergies   Outpatient Medications Prior to Visit  Medication Sig Dispense Refill  . albuterol (PROAIR HFA) 108 (90 Base) MCG/ACT inhaler Inhale 2 puffs into the lungs every 6 (six) hours as needed for wheezing or shortness of breath.    Marland Kitchen aspirin 81 MG tablet Take 81 mg by mouth daily.    . Cholecalciferol (VITAMIN D3) 5000 units CAPS Take 1 capsule by mouth daily.    . Coenzyme Q10 (COQ10) 200 MG CAPS Take 1 capsule by mouth daily.    . fluticasone-salmeterol (ADVAIR HFA) 230-21 MCG/ACT inhaler Inhale 2 puffs into the lungs 2 (two) times daily.    Marland Kitchen loratadine-pseudoephedrine (CLARITIN-D 24 HOUR) 10-240 MG 24 hr tablet Take 1 tablet by mouth daily.    . Omega-3 Fatty Acids (FISH OIL) 1000 MG CAPS Take 1 capsule by mouth daily.    . rosuvastatin (CRESTOR) 5 MG tablet Take 5 mg by mouth daily.    . tadalafil (CIALIS) 5 MG tablet Take 5 mg by mouth daily as needed for erectile dysfunction.    . Testosterone (AXIRON) 30 MG/ACT SOLN Place 1 application onto the skin daily.    Marland Kitchen tiotropium (SPIRIVA) 18 MCG inhalation capsule Place 18 mcg into inhaler and inhale daily.    . vitamin B-12 (CYANOCOBALAMIN) 1000 MCG tablet Take 1,000 mcg by mouth daily.    Marland Kitchen amoxicillin-clavulanate (AUGMENTIN) 875-125 MG tablet Take 1 tablet by mouth 2 (two) times daily. (Patient not taking: Reported on 10/31/2015) 60 tablet 0   No facility-administered medications prior to visit.          Objective:   Physical Exam Vitals:   10/31/15 1430  BP: 104/64  Pulse: 96  SpO2: 97%  Weight: 194 lb 9.6 oz (88.3 kg)  Height: 6' (1.829 m)   Gen: Pleasant, well-nourished, in no distress,  normal affect  ENT: No lesions,  mouth clear,  oropharynx clear, no postnasal drip  Neck: No JVD, no TMG, no carotid bruits  Lungs: No use of accessory muscles, clear without rales or rhonchi  Cardiovascular: RRR, heart  sounds normal, no murmur or gallops, no peripheral edema  Musculoskeletal: No deformities, no cyanosis or clubbing  Neuro: alert, non focal  Skin: Warm, no lesions or rashes      Assessment & Plan:  Cavitating mass in right upper lung lobe Carolynn Serve more this lesion is behaving like a lung abscess and not a malignancy. It is smaller on the most recent CT scan of the chest from 10/29/15. His after treatment with Augmentin for one month. I underscored with him today that is possible that there alternative organisms like Actinomyces or nocardia that could be contributing here. He may still need a repeat bronchoscopy depending on response to more therapy for a lung abscess. I also explained to him that we may decide to consider referral to thoracic surgery to remove the area if it will not resolve on medical therapy.  Baltazar Apo, MD, PhD 10/31/2015,  3:02 PM [facility] [phone] or if no answer [phone]

## 2015-10-31 NOTE — Patient Instructions (Signed)
We will restart Augmentin 875mg  twice a day for the next 6 weeks.  We will perform a CT scan of the chest without contrast in mid December.  Follow with Dr Lamonte Sakai in December to review your scan, or sooner if you have any problems.

## 2015-10-31 NOTE — Assessment & Plan Note (Signed)
Morin more this lesion is behaving like a lung abscess and not a malignancy. It is smaller on the most recent CT scan of the chest from 10/29/15. His after treatment with Augmentin for one month. I underscored with him today that is possible that there alternative organisms like Actinomyces or nocardia that could be contributing here. He may still need a repeat bronchoscopy depending on response to more therapy for a lung abscess. I also explained to him that we may decide to consider referral to thoracic surgery to remove the area if it will not resolve on medical therapy.

## 2015-12-30 ENCOUNTER — Ambulatory Visit (INDEPENDENT_AMBULATORY_CARE_PROVIDER_SITE_OTHER)
Admission: RE | Admit: 2015-12-30 | Discharge: 2015-12-30 | Disposition: A | Discharge status: Home / Self Care | Payer: BC Managed Care – PPO | Source: Ambulatory Visit | Attending: Emergency Medicine | Admitting: Emergency Medicine

## 2015-12-30 DIAGNOSIS — R918 Other nonspecific abnormal finding of lung field: Secondary | ICD-10-CM | POA: Diagnosis not present

## 2015-12-31 ENCOUNTER — Encounter: Payer: Self-pay | Admitting: Emergency Medicine

## 2015-12-31 ENCOUNTER — Ambulatory Visit (INDEPENDENT_AMBULATORY_CARE_PROVIDER_SITE_OTHER): Payer: BC Managed Care – PPO | Admitting: Emergency Medicine

## 2015-12-31 VITALS — BP 110/82 | HR 100 | Ht 72.0 in | Wt 189.0 lb

## 2015-12-31 DIAGNOSIS — J984 Other disorders of lung: Secondary | ICD-10-CM | POA: Diagnosis not present

## 2015-12-31 DIAGNOSIS — R918 Other nonspecific abnormal finding of lung field: Secondary | ICD-10-CM

## 2015-12-31 MED ORDER — LEVOFLOXACIN 500 MG PO TABS: 500.0000 mg | ORAL_TABLET | Freq: Every day | ORAL | 0 refills | Status: DC

## 2015-12-31 MED ORDER — PREDNISONE 10 MG PO TABS: ORAL_TABLET | ORAL | 0 refills | Status: DC

## 2015-12-31 NOTE — Assessment & Plan Note (Signed)
With an acute exacerbation in the setting of an upper respiratory infection.  Please continue your Advair and Spiriva as you are taking them  Take albuterol 2 puffs up to every 4 hours if needed for shortness of breath.  Please take prednisone as directed until completely gone Take levaquin 500mg  daily until completely gone.  Follow with Dr Lamonte Sakai in 6 months or sooner if you have any problems

## 2015-12-31 NOTE — Progress Notes (Signed)
Subjective:    Patient ID: Melvin White, male    DOB: 1955-05-13, 60 y.o.   MRN: [id number]  HPI 60 year old active smoker (45 pack years), with a history of allergic rhinitis, hypertension. Carries a diagnosis of COPD. Currently treated with Spiriva, Advair, pro-air when necessary. He takes loratadine D. He has 2-3 months of progressive SOB and fever. Also some progression of sputum. Was treated for possible PNA, imaged. No hemoptysis. He has been evaluated for an abnormality on CXR and Chest CT at [facility] that I personally reviewed:  CT chest  05/30/15 > Show severe upper lobe predominant emphysema, speculated right upper lobe mass measuring 10 x 3 x 4.4 cm with some areas of necrotic change, also noted was some proximal esophageal thickening, some right lower lobe groundglass opacity of unclear significance. PET 06/12/15>  Significant hypermetabolism of the right upper lobe cavitary mass with an SUV of 11.3. Tiny clustered right mid lobe nodules with mild hypermetabolic activity no evidence of distant disease in the abdomen or bony skeleton.   ROV 08/20/15 -- Follow-up visit for history of tobacco use, COPD and a spiculated right upper lobe mass with some areas of necrotic change, hypermetabolic on PET scan from 06/12/15. He underwent bronchoscopy on 07/12/15. Cytology and pathology were negative. Given my index of suspicion I referred him for needle biopsy in interventional radiology. This apparently has not yet been done.  He is actually doing very well. Denies any change in dyspnea or functional capacity. No weight loss.     ROV 09/17/15 -- Patient has a history of tobacco use, right upper lobe spiculated mass. Cytology and pathology were benign on 07/12/15 bronchoscopy. He underwent repeat CT scan of the chest on 08/27/15 that I have personally reviewed And which shows Some inferior clearing and decrease in size but with some progressive partially cavitary process at the right apex appears to be most  consistent with infectious process.    ROV 10/31/15 -- is a follow-up visit for a right upper lobe cavitary and spiculated lesion that we have evaluated by bronchoscopy and have followed by serial CT scans of the chest. He has been treated with an month of Augmentin beginning 09/17/15. We then repeated his CT scan of the chest on 10/29/15 and I have reviewed > shows continued decrease in size of his dominant right upper lobe cavitary focus. His breathing improved on the augmentin. His cough has decreased significantly.   ROV 12/31/15 -- follow-up visit for history of tobacco use, COPD, and a right upper lobe cavitary lesion that we have followed with serial CT scans. He underwent bronchoscopy that was unrevealing. We then treated him with Augmentin for possible cavitary pneumonia/lung abscess. On CT scan from October there was a decrease in size of the right upper lobe lesion. He returns today after a repeat scan done 12/30/15 that I personally reviewed. This shows continued decrease in size and involution of the right upper lobe lesion.  He reports that her has been well until he developed URI sx about 5 days ago. He has nasal congestion, drainage, cough productive of purulent material, some blood tinged sputum.  He has had more dyspnea as well, using his SABA prn.    Review of Systems  Constitutional: Negative for fever and unexpected weight change.  HENT: Negative for congestion, dental problem, ear pain, nosebleeds, postnasal drip, rhinorrhea, sinus pressure, sneezing, sore throat and trouble swallowing.   Eyes: Negative for redness and itching.  Respiratory: Positive for cough. Negative for chest  tightness, shortness of breath and wheezing.   Cardiovascular: Negative for palpitations and leg swelling.  Gastrointestinal: Negative for nausea and vomiting.  Genitourinary: Negative for dysuria.  Musculoskeletal: Negative for joint swelling.  Skin: Negative for rash.  Neurological: Negative for  headaches.  Hematological: Does not bruise/bleed easily.  Psychiatric/Behavioral: Negative for dysphoric mood. The patient is not nervous/anxious.     Past Medical History:  Diagnosis Date  . Allergic rhinitis   . Emphysema of lung ([place])   . Hyperlipidemia      Family History  Problem Relation Age of Onset  . Lung cancer Father      Social History   Social History  . Marital status: Married    Spouse name: N/A  . Number of children: N/A  . Years of education: N/A   Occupational History  . Not on file.   Social History Main Topics  . Smoking status: Current Every Day Smoker    Packs/day: 1.00    Years: 45.00    Types: Cigarettes  . Smokeless tobacco: Never Used  . Alcohol use No  . Drug use: No  . Sexual activity: Not on file   Other Topics Concern  . Not on file   Social History Narrative  . No narrative on file     No Known Allergies   Outpatient Medications Prior to Visit  Medication Sig Dispense Refill  . albuterol (PROAIR HFA) 108 (90 Base) MCG/ACT inhaler Inhale 2 puffs into the lungs every 6 (six) hours as needed for wheezing or shortness of breath.    Marland Kitchen aspirin 81 MG tablet Take 81 mg by mouth daily.    . Cholecalciferol (VITAMIN D3) 5000 units CAPS Take 1 capsule by mouth daily.    . Coenzyme Q10 (COQ10) 200 MG CAPS Take 1 capsule by mouth daily.    . fluticasone-salmeterol (ADVAIR HFA) 230-21 MCG/ACT inhaler Inhale 2 puffs into the lungs 2 (two) times daily.    Marland Kitchen loratadine-pseudoephedrine (CLARITIN-D 24 HOUR) 10-240 MG 24 hr tablet Take 1 tablet by mouth daily.    . Omega-3 Fatty Acids (FISH OIL) 1000 MG CAPS Take 1 capsule by mouth daily.    . rosuvastatin (CRESTOR) 5 MG tablet Take 5 mg by mouth daily.    . tadalafil (CIALIS) 5 MG tablet Take 5 mg by mouth daily as needed for erectile dysfunction.    . Testosterone (AXIRON) 30 MG/ACT SOLN Place 1 application onto the skin daily.    Marland Kitchen tiotropium (SPIRIVA) 18 MCG inhalation capsule Place 18 mcg  into inhaler and inhale daily.    . vitamin B-12 (CYANOCOBALAMIN) 1000 MCG tablet Take 1,000 mcg by mouth daily.    Marland Kitchen amoxicillin-clavulanate (AUGMENTIN) 875-125 MG tablet Take 1 tablet by mouth 2 (two) times daily. 84 tablet 0   No facility-administered medications prior to visit.          Objective:   Physical Exam Vitals:   12/31/15 1530  BP: 110/82  Pulse: 100  SpO2: 94%  Weight: 189 lb (85.7 kg)  Height: 6' (1.829 m)   Gen: Pleasant, well-nourished, in no distress,  normal affect  ENT: No lesions,  mouth clear,  oropharynx clear, no postnasal drip  Neck: No JVD, no TMG, no carotid bruits  Lungs: No use of accessory muscles, clear without rales or rhonchi  Cardiovascular: RRR, heart sounds normal, no murmur or gallops, no peripheral edema  Musculoskeletal: No deformities, no cyanosis or clubbing  Neuro: alert, non focal  Skin: Warm, no lesions  or rashes      Assessment & Plan:  COPD (chronic obstructive pulmonary disease) ([place]) With an acute exacerbation in the setting of an upper respiratory infection.  Please continue your Advair and Spiriva as you are taking them  Take albuterol 2 puffs up to every 4 hours if needed for shortness of breath.  Please take prednisone as directed until completely gone Take levaquin 500mg  daily until completely gone.  Follow with Dr Lamonte Sakai in 6 months or sooner if you have any problems  Cavitating mass in right upper lung lobe As we continue to follow his CT scan with stability after anabiotic therapy this becomes more and more likely to be a resolved lung abscess. I will repeat his CT scan in 6 months  Baltazar Apo, MD, PhD 12/31/2015, 3:46 PM [facility] [phone] or if no answer [phone]

## 2015-12-31 NOTE — Assessment & Plan Note (Signed)
As we continue to follow his CT scan with stability after anabiotic therapy this becomes more and more likely to be a resolved lung abscess. I will repeat his CT scan in 6 months

## 2015-12-31 NOTE — Patient Instructions (Signed)
Please continue your Advair and Spiriva as you are taking them  Take albuterol 2 puffs up to every 4 hours if needed for shortness of breath.  Please take prednisone as directed until completely gone Take levaquin 500mg  daily until completely gone.  We will repeat your CT chest in 6 months.  Follow with Dr Lamonte Sakai in 6 months or sooner if you have any problems

## 2016-02-27 ENCOUNTER — Ambulatory Visit (INDEPENDENT_AMBULATORY_CARE_PROVIDER_SITE_OTHER): Payer: BC Managed Care – PPO | Admitting: Emergency Medicine

## 2016-02-27 ENCOUNTER — Encounter: Payer: Self-pay | Admitting: Emergency Medicine

## 2016-02-27 DIAGNOSIS — J984 Other disorders of lung: Secondary | ICD-10-CM

## 2016-02-27 NOTE — Progress Notes (Signed)
Subjective:    Patient ID: Melvin White, male    DOB: 01-07-1956, 61 y.o.   MRN: [id number]  HPI 61 year old active smoker (45 pack years), with a history of allergic rhinitis, hypertension. Carries a diagnosis of COPD. Currently treated with Spiriva, Advair, pro-air when necessary. He takes loratadine D. He has 2-3 months of progressive SOB and fever. Also some progression of sputum. Was treated for possible PNA, imaged. No hemoptysis. He has been evaluated for an abnormality on CXR and Chest CT at [facility] that I personally reviewed:  CT chest  05/30/15 > Show severe upper lobe predominant emphysema, speculated right upper lobe mass measuring 10 x 3 x 4.4 cm with some areas of necrotic change, also noted was some proximal esophageal thickening, some right lower lobe groundglass opacity of unclear significance. PET 06/12/15>  Significant hypermetabolism of the right upper lobe cavitary mass with an SUV of 11.3. Tiny clustered right mid lobe nodules with mild hypermetabolic activity no evidence of distant disease in the abdomen or bony skeleton.   ROV 08/20/15 -- Follow-up visit for history of tobacco use, COPD and a spiculated right upper lobe mass with some areas of necrotic change, hypermetabolic on PET scan from 06/12/15. He underwent bronchoscopy on 07/12/15. Cytology and pathology were negative. Given my index of suspicion I referred him for needle biopsy in interventional radiology. This apparently has not yet been done.  He is actually doing very well. Denies any change in dyspnea or functional capacity. No weight loss.     ROV 09/17/15 -- Patient has a history of tobacco use, right upper lobe spiculated mass. Cytology and pathology were benign on 07/12/15 bronchoscopy. He underwent repeat CT scan of the chest on 08/27/15 that I have personally reviewed And which shows Some inferior clearing and decrease in size but with some progressive partially cavitary process at the right apex appears to be most  consistent with infectious process.    ROV 10/31/15 -- is a follow-up visit for a right upper lobe cavitary and spiculated lesion that we have evaluated by bronchoscopy and have followed by serial CT scans of the chest. He has been treated with an month of Augmentin beginning 09/17/15. We then repeated his CT scan of the chest on 10/29/15 and I have reviewed > shows continued decrease in size of his dominant right upper lobe cavitary focus. His breathing improved on the augmentin. His cough has decreased significantly.   ROV 12/31/15 -- follow-up visit for history of tobacco use, COPD, and a right upper lobe cavitary lesion that we have followed with serial CT scans. He underwent bronchoscopy that was unrevealing. We then treated him with Augmentin for possible cavitary pneumonia/lung abscess. On CT scan from October there was a decrease in size of the right upper lobe lesion. He returns today after a repeat scan done 12/30/15 that I personally reviewed. This shows continued decrease in size and involution of the right upper lobe lesion.  He reports that her has been well until he developed URI sx about 5 days ago. He has nasal congestion, drainage, cough productive of purulent material, some blood tinged sputum.  He has had more dyspnea as well, using his SABA prn.   ROV 02/27/16 -- this is a follow-up visit for COPD and a right upper lobe cavitary lesion that we have been following with serial CT scans. He was treated initially with Augmentin. The lesion decreased in size. Last visit I treated him w levaquin and pred but he did not  improve. Since our last visit he has been seen for worsening sx, was changed to Augmentin. Then cx positive for [facility] >> started on ethambutol, rifampin, clarithromycin and Augmentin was stopped. He has seen some blood.     Review of Systems  Constitutional: Negative for fever and unexpected weight change.  HENT: Negative for congestion, dental problem, ear pain, nosebleeds,  postnasal drip, rhinorrhea, sinus pressure, sneezing, sore throat and trouble swallowing.   Eyes: Negative for redness and itching.  Respiratory: Positive for cough. Negative for chest tightness, shortness of breath and wheezing.   Cardiovascular: Negative for palpitations and leg swelling.  Gastrointestinal: Negative for nausea and vomiting.  Genitourinary: Negative for dysuria.  Musculoskeletal: Negative for joint swelling.  Skin: Negative for rash.  Neurological: Negative for headaches.  Hematological: Does not bruise/bleed easily.  Psychiatric/Behavioral: Negative for dysphoric mood. The patient is not nervous/anxious.     Past Medical History:  Diagnosis Date  . Allergic rhinitis   . Emphysema of lung ([place])   . Hyperlipidemia      Family History  Problem Relation Age of Onset  . Lung cancer Father      Social History   Social History  . Marital status: Married    Spouse name: N/A  . Number of children: N/A  . Years of education: N/A   Occupational History  . Not on file.   Social History Main Topics  . Smoking status: Current Every Day Smoker    Packs/day: 1.00    Years: 45.00    Types: Cigarettes  . Smokeless tobacco: Never Used  . Alcohol use No  . Drug use: No  . Sexual activity: Not on file   Other Topics Concern  . Not on file   Social History Narrative  . No narrative on file     No Known Allergies   Outpatient Medications Prior to Visit  Medication Sig Dispense Refill  . albuterol (PROAIR HFA) 108 (90 Base) MCG/ACT inhaler Inhale 2 puffs into the lungs every 6 (six) hours as needed for wheezing or shortness of breath.    Marland Kitchen aspirin 81 MG tablet Take 81 mg by mouth daily.    . Cholecalciferol (VITAMIN D3) 5000 units CAPS Take 1 capsule by mouth daily.    . fluticasone-salmeterol (ADVAIR HFA) 230-21 MCG/ACT inhaler Inhale 2 puffs into the lungs 2 (two) times daily.    Marland Kitchen loratadine-pseudoephedrine (CLARITIN-D 24 HOUR) 10-240 MG 24 hr tablet Take 1  tablet by mouth daily.    . rosuvastatin (CRESTOR) 5 MG tablet Take 5 mg by mouth daily.    . tadalafil (CIALIS) 5 MG tablet Take 5 mg by mouth daily as needed for erectile dysfunction.    . Testosterone (AXIRON) 30 MG/ACT SOLN Place 1 application onto the skin daily.    Marland Kitchen tiotropium (SPIRIVA) 18 MCG inhalation capsule Place 18 mcg into inhaler and inhale daily.    . vitamin B-12 (CYANOCOBALAMIN) 1000 MCG tablet Take 1,000 mcg by mouth daily.    . Coenzyme Q10 (COQ10) 200 MG CAPS Take 1 capsule by mouth daily.    Marland Kitchen levofloxacin (LEVAQUIN) 500 MG tablet Take 1 tablet (500 mg total) by mouth daily. 7 tablet 0  . Omega-3 Fatty Acids (FISH OIL) 1000 MG CAPS Take 1 capsule by mouth daily.    . predniSONE (DELTASONE) 10 MG tablet Take 4 tablets for 3 days, 3 tablets for 3 days, 2 tablets for 3 days, 1 tablet for 3 days 30 tablet 0   No  facility-administered medications prior to visit.          Objective:   Physical Exam Vitals:   02/27/16 1551  BP: 112/64  Pulse: 96  SpO2: 96%  Weight: 184 lb (83.5 kg)  Height: 6' (1.829 m)   Gen: Pleasant, well-nourished, in no distress,  normal affect  ENT: No lesions,  mouth clear,  oropharynx clear, no postnasal drip  Neck: No JVD, no TMG, no carotid bruits  Lungs: No use of accessory muscles, clear without rales or rhonchi  Cardiovascular: RRR, heart sounds normal, no murmur or gallops, no peripheral edema  Musculoskeletal: No deformities, no cyanosis or clubbing  Neuro: alert, non focal  Skin: Warm, no lesions or rashes      Assessment & Plan:  Cavitating mass in right upper lung lobe Please continue clarithromycin, ethambutol, rifampin as you are taking them. Please contact our office if you have any worsening in mucous production or are coughing up blood regularly. Follow with Dr Lamonte Sakai in 1 month We will discuss the timing of a repeat Ct scan of the chest at your next visit.   Baltazar Apo, MD, PhD 03/25/2016, 12:19 PM [place]  [facility] [phone] or if no answer [phone]

## 2016-02-27 NOTE — Patient Instructions (Signed)
Please continue clarithromycin, ethambutol, rifampin as you are taking them. Please contact our office if you have any worsening in mucous production or are coughing up blood regularly. Follow with Dr Byrum in 1 month We will discuss the timing of a repeat Ct scan of the chest at your next visit.  

## 2016-03-24 ENCOUNTER — Encounter: Payer: Self-pay | Admitting: Emergency Medicine

## 2016-03-24 ENCOUNTER — Ambulatory Visit (INDEPENDENT_AMBULATORY_CARE_PROVIDER_SITE_OTHER): Payer: BC Managed Care – PPO | Admitting: Emergency Medicine

## 2016-03-24 VITALS — BP 114/68 | HR 91 | Ht 72.0 in | Wt 183.4 lb

## 2016-03-24 DIAGNOSIS — J984 Other disorders of lung: Secondary | ICD-10-CM

## 2016-03-24 DIAGNOSIS — J449 Chronic obstructive pulmonary disease, unspecified: Secondary | ICD-10-CM | POA: Diagnosis not present

## 2016-03-24 MED ORDER — UMECLIDINIUM-VILANTEROL 62.5-25 MCG/INH IN AEPB: 1.0000 | INHALATION_SPRAY | Freq: Every day | RESPIRATORY_TRACT | 0 refills | Status: DC

## 2016-03-24 NOTE — Progress Notes (Signed)
Subjective:    Patient ID: Melvin White, male    DOB: 01-07-1956, 61 y.o.   MRN: [id number]  HPI 61 year old active smoker (45 pack years), with a history of allergic rhinitis, hypertension. Carries a diagnosis of COPD. Currently treated with Spiriva, Advair, pro-air when necessary. He takes loratadine D. He has 2-3 months of progressive SOB and fever. Also some progression of sputum. Was treated for possible PNA, imaged. No hemoptysis. He has been evaluated for an abnormality on CXR and Chest CT at [facility] that I personally reviewed:  CT chest  05/30/15 > Show severe upper lobe predominant emphysema, speculated right upper lobe mass measuring 10 x 3 x 4.4 cm with some areas of necrotic change, also noted was some proximal esophageal thickening, some right lower lobe groundglass opacity of unclear significance. PET 06/12/15>  Significant hypermetabolism of the right upper lobe cavitary mass with an SUV of 11.3. Tiny clustered right mid lobe nodules with mild hypermetabolic activity no evidence of distant disease in the abdomen or bony skeleton.   ROV 08/20/15 -- Follow-up visit for history of tobacco use, COPD and a spiculated right upper lobe mass with some areas of necrotic change, hypermetabolic on PET scan from 06/12/15. He underwent bronchoscopy on 07/12/15. Cytology and pathology were negative. Given my index of suspicion I referred him for needle biopsy in interventional radiology. This apparently has not yet been done.  He is actually doing very well. Denies any change in dyspnea or functional capacity. No weight loss.     ROV 09/17/15 -- Patient has a history of tobacco use, right upper lobe spiculated mass. Cytology and pathology were benign on 07/12/15 bronchoscopy. He underwent repeat CT scan of the chest on 08/27/15 that I have personally reviewed And which shows Some inferior clearing and decrease in size but with some progressive partially cavitary process at the right apex appears to be most  consistent with infectious process.    ROV 10/31/15 -- is a follow-up visit for a right upper lobe cavitary and spiculated lesion that we have evaluated by bronchoscopy and have followed by serial CT scans of the chest. He has been treated with an month of Augmentin beginning 09/17/15. We then repeated his CT scan of the chest on 10/29/15 and I have reviewed > shows continued decrease in size of his dominant right upper lobe cavitary focus. His breathing improved on the augmentin. His cough has decreased significantly.   ROV 12/31/15 -- follow-up visit for history of tobacco use, COPD, and a right upper lobe cavitary lesion that we have followed with serial CT scans. He underwent bronchoscopy that was unrevealing. We then treated him with Augmentin for possible cavitary pneumonia/lung abscess. On CT scan from October there was a decrease in size of the right upper lobe lesion. He returns today after a repeat scan done 12/30/15 that I personally reviewed. This shows continued decrease in size and involution of the right upper lobe lesion.  He reports that her has been well until he developed URI sx about 5 days ago. He has nasal congestion, drainage, cough productive of purulent material, some blood tinged sputum.  He has had more dyspnea as well, using his SABA prn.   ROV 02/27/16 -- this is a follow-up visit for COPD and a right upper lobe cavitary lesion that we have been following with serial CT scans. He was treated initially with Augmentin. The lesion decreased in size. Last visit I treated him w levaquin and pred but he did not  improve. Since our last visit he has been seen for worsening sx, was changed to Augmentin. Then cx positive for [facility] >> started on ethambutol, rifampin, clarithromycin and Augmentin was stopped. He has seen some blood.   ROV 03/24/16 -- follow up for COPD, RUL cavitary lesion. Suspected that this was an abscess (anaerobic) but then he grew out [facility]. Currently on 3 drug therapy. Last  Ct chest was 12/17. He is tolerating well. His hemoptysis has resolved    Sensitive to clarithro, ethambutol, rifampin 02/28/16 report    Review of Systems  Constitutional: Negative for fever and unexpected weight change.  HENT: Negative for congestion, dental problem, ear pain, nosebleeds, postnasal drip, rhinorrhea, sinus pressure, sneezing, sore throat and trouble swallowing.   Eyes: Negative for redness and itching.  Respiratory: Positive for cough. Negative for chest tightness, shortness of breath and wheezing.   Cardiovascular: Negative for palpitations and leg swelling.  Gastrointestinal: Negative for nausea and vomiting.  Genitourinary: Negative for dysuria.  Musculoskeletal: Negative for joint swelling.  Skin: Negative for rash.  Neurological: Negative for headaches.  Hematological: Does not bruise/bleed easily.  Psychiatric/Behavioral: Negative for dysphoric mood. The patient is not nervous/anxious.     Past Medical History:  Diagnosis Date  . Allergic rhinitis   . Emphysema of lung ([place])   . Hyperlipidemia      Family History  Problem Relation Age of Onset  . Lung cancer Father      Social History   Social History  . Marital status: Married    Spouse name: N/A  . Number of children: N/A  . Years of education: N/A   Occupational History  . Not on file.   Social History Main Topics  . Smoking status: Current Every Day Smoker    Packs/day: 1.00    Years: 45.00    Types: Cigarettes  . Smokeless tobacco: Never Used  . Alcohol use No  . Drug use: No  . Sexual activity: Not on file   Other Topics Concern  . Not on file   Social History Narrative  . No narrative on file     No Known Allergies   Outpatient Medications Prior to Visit  Medication Sig Dispense Refill  . albuterol (PROAIR HFA) 108 (90 Base) MCG/ACT inhaler Inhale 2 puffs into the lungs every 6 (six) hours as needed for wheezing or shortness of breath.    Marland Kitchen aspirin 81 MG tablet Take 81 mg  by mouth daily.    . Cholecalciferol (VITAMIN D3) 5000 units CAPS Take 1 capsule by mouth daily.    . clarithromycin (BIAXIN) 500 MG tablet Take 500 mg by mouth 2 (two) times daily.    Marland Kitchen ethambutol (MYAMBUTOL) 400 MG tablet Take 400 mg by mouth daily. Take 3 tablets daily    . fluticasone-salmeterol (ADVAIR HFA) 230-21 MCG/ACT inhaler Inhale 2 puffs into the lungs 2 (two) times daily.    . folic acid (FOLVITE) 1 MG tablet Take 1 mg by mouth 2 (two) times daily.    Marland Kitchen loratadine-pseudoephedrine (CLARITIN-D 24 HOUR) 10-240 MG 24 hr tablet Take 1 tablet by mouth daily.    . ondansetron (ZOFRAN-ODT) 4 MG disintegrating tablet Take 4 mg by mouth every 8 (eight) hours as needed for nausea or vomiting.    . rifampin (RIFADIN) 300 MG capsule Take 300 mg by mouth daily. Take 2 capsules daily    . rosuvastatin (CRESTOR) 5 MG tablet Take 5 mg by mouth daily.    . tadalafil (CIALIS) 5  MG tablet Take 5 mg by mouth daily as needed for erectile dysfunction.    . Testosterone (AXIRON) 30 MG/ACT SOLN Place 1 application onto the skin daily.    Marland Kitchen tiotropium (SPIRIVA) 18 MCG inhalation capsule Place 18 mcg into inhaler and inhale daily.    . vitamin B-12 (CYANOCOBALAMIN) 1000 MCG tablet Take 1,000 mcg by mouth daily.    . predniSONE (DELTASONE) 10 MG tablet Take 4 tablets for 3 days, 3 tablets for 3 days, 2 tablets for 3 days, 1 tablet for 3 days 30 tablet 0   No facility-administered medications prior to visit.          Objective:   Physical Exam Vitals:   03/24/16 1429  BP: 114/68  Pulse: 91  SpO2: 98%  Weight: 183 lb 6.4 oz (83.2 kg)  Height: 6' (1.829 m)   Gen: Pleasant, well-nourished, in no distress,  normal affect  ENT: No lesions,  mouth clear,  oropharynx clear, no postnasal drip  Neck: No JVD, no TMG, no carotid bruits  Lungs: No use of accessory muscles, clear without rales or rhonchi  Cardiovascular: RRR, heart sounds normal, no murmur or gallops, no peripheral  edema  Musculoskeletal: No deformities, no cyanosis or clubbing  Neuro: alert, non focal  Skin: Warm, no lesions or rashes      Assessment & Plan:  Cavitating mass in right upper lung lobe Has been treated with some good effect for a lung abscess since of known him. Size of his right upper lobe cavitary lesion decreased. 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 It's possible that he had multiple organisms. He is currently only on therapy for the [facility], appears to be clinically benefiting. He is tolerating the medications without difficulty. Sensitivities show that the regimen should be adequate. I believe that we can continue his current regimen, plan to repeat his CT chest in June as we had originally scheduled. Depending on his clinical response, radiographical response we will decide whether he needs broader antibiotic coverage or even consideration of surgical resection. If we believe is responding well to mycobacterial therapy then he will need repeat culture data from sputum or bronchoscopy several months in the future. Suspect he would require 9-12 months of therapy.   Continue your antibiotics as you have been taking them  We will repeat your CT scan in June 2018.  Call us if you develop fevers, chills, increase in mucous production, blood in your mucous. We may need to recheck a sputum culture or perform you CT scan sooner if this happens. Follow with Dr Lamonte Sakai in June after your Ct scan to review.  COPD (chronic obstructive pulmonary disease) (HCC) We will try stopping Advair and Spiriva for now.  We will start Anoro one inhalation daily for the next month. If you do better on the Anoro then we will order through your pharmacy  Baltazar Apo, MD, PhD 03/25/2016, 12:04 PM [facility] [phone] or if no answer [phone]

## 2016-03-24 NOTE — Patient Instructions (Addendum)
Continue your antibiotics as you have been taking them  We will repeat your CT scan in June 2018.  Call us if you develop fevers, chills, increase in mucous production, blood in your mucous. We may need to recheck a sputum culture or perform you CT scan sooner if this happens. We will try stopping Advair and Spiriva for now.  We will start Anoro one inhalation daily for the next month. If you do better on the Anoro then we will order through your pharmacy. Call us to let us know.  Follow with Dr Lamonte Sakai in June after your Ct scan to review.

## 2016-03-24 NOTE — Progress Notes (Signed)
Patient seen in the office today and instructed on use of Anoro.  Patient expressed understanding and demonstrated technique. Benetta Spar [facility] 03/24/16

## 2016-03-25 NOTE — Assessment & Plan Note (Signed)
We will try stopping Advair and Spiriva for now.  We will start Anoro one inhalation daily for the next month. If you do better on the Anoro then we will order through your pharmacy

## 2016-03-25 NOTE — Assessment & Plan Note (Signed)
Has been treated with some good effect for a lung abscess since of known him. Size of his right upper lobe cavitary lesion decreased. 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 It's possible that he had multiple organisms. He is currently only on therapy for the [facility], appears to be clinically benefiting. He is tolerating the medications without difficulty. Sensitivities show that the regimen should be adequate. I believe that we can continue his current regimen, plan to repeat his CT chest in June as we had originally scheduled. Depending on his clinical response, radiographical response we will decide whether he needs broader antibiotic coverage or even consideration of surgical resection. If we believe is responding well to mycobacterial therapy then he will need repeat culture data from sputum or bronchoscopy several months in the future. Suspect he would require 9-12 months of therapy.   Continue your antibiotics as you have been taking them  We will repeat your CT scan in June 2018.  Call us if you develop fevers, chills, increase in mucous production, blood in your mucous. We may need to recheck a sputum culture or perform you CT scan sooner if this happens. Follow with Dr Lamonte Sakai in June after your Ct scan to review.

## 2016-03-25 NOTE — Assessment & Plan Note (Signed)
Please continue clarithromycin, ethambutol, rifampin as you are taking them. Please contact our office if you have any worsening in mucous production or are coughing up blood regularly. Follow with Dr Lamonte Sakai in 1 month We will discuss the timing of a repeat Ct scan of the chest at your next visit.

## 2016-04-05 ENCOUNTER — Other Ambulatory Visit: Payer: Self-pay | Admitting: Emergency Medicine

## 2016-04-06 MED ORDER — UMECLIDINIUM-VILANTEROL 62.5-25 MCG/INH IN AEPB: 1.0000 | INHALATION_SPRAY | Freq: Every day | RESPIRATORY_TRACT | 5 refills | Status: DC

## 2016-07-02 ENCOUNTER — Other Ambulatory Visit: Payer: BC Managed Care – PPO

## 2016-07-06 ENCOUNTER — Ambulatory Visit (INDEPENDENT_AMBULATORY_CARE_PROVIDER_SITE_OTHER)
Admission: RE | Admit: 2016-07-06 | Discharge: 2016-07-06 | Disposition: A | Discharge status: Home / Self Care | Payer: BC Managed Care – PPO | Source: Ambulatory Visit | Attending: Emergency Medicine | Admitting: Emergency Medicine

## 2016-07-06 ENCOUNTER — Telehealth: Payer: Self-pay | Admitting: Emergency Medicine

## 2016-07-06 DIAGNOSIS — R918 Other nonspecific abnormal finding of lung field: Secondary | ICD-10-CM

## 2016-07-06 NOTE — Telephone Encounter (Signed)
Spoke with pt. This was in regards to his appointment on 07/08/16. Nothing further was needed at this time.

## 2016-07-06 NOTE — Telephone Encounter (Signed)
Pt returned phone call..ert ° ° °

## 2016-07-08 ENCOUNTER — Encounter: Payer: Self-pay | Admitting: Emergency Medicine

## 2016-07-08 ENCOUNTER — Ambulatory Visit (INDEPENDENT_AMBULATORY_CARE_PROVIDER_SITE_OTHER): Payer: BC Managed Care – PPO | Admitting: Emergency Medicine

## 2016-07-08 VITALS — BP 102/68 | HR 95 | Ht 73.0 in | Wt 177.0 lb

## 2016-07-08 DIAGNOSIS — J984 Other disorders of lung: Secondary | ICD-10-CM | POA: Diagnosis not present

## 2016-07-08 DIAGNOSIS — J852 Abscess of lung without pneumonia: Secondary | ICD-10-CM

## 2016-07-08 DIAGNOSIS — A310 Pulmonary mycobacterial infection: Secondary | ICD-10-CM | POA: Insufficient documentation

## 2016-07-08 NOTE — Assessment & Plan Note (Signed)
He has been on 3 drug therapy since 01/27/16. We will continue this for now. Recheck cultures for AFB if he is unable to produce sputum then we will likely need to perform bronchoscopy again.

## 2016-07-08 NOTE — Progress Notes (Signed)
Subjective:    Patient ID: Melvin White, male    DOB: 01-07-1956, 61 y.o.   MRN: [id number]  HPI 61 year old active smoker (45 pack years), with a history of allergic rhinitis, hypertension. Carries a diagnosis of COPD. Currently treated with Spiriva, Advair, pro-air when necessary. He takes loratadine D. He has 2-3 months of progressive SOB and fever. Also some progression of sputum. Was treated for possible PNA, imaged. No hemoptysis. He has been evaluated for an abnormality on CXR and Chest CT at [facility] that I personally reviewed:  CT chest  05/30/15 > Show severe upper lobe predominant emphysema, speculated right upper lobe mass measuring 10 x 3 x 4.4 cm with some areas of necrotic change, also noted was some proximal esophageal thickening, some right lower lobe groundglass opacity of unclear significance. PET 06/12/15>  Significant hypermetabolism of the right upper lobe cavitary mass with an SUV of 11.3. Tiny clustered right mid lobe nodules with mild hypermetabolic activity no evidence of distant disease in the abdomen or bony skeleton.   ROV 08/20/15 -- Follow-up visit for history of tobacco use, COPD and a spiculated right upper lobe mass with some areas of necrotic change, hypermetabolic on PET scan from 06/12/15. He underwent bronchoscopy on 07/12/15. Cytology and pathology were negative. Given my index of suspicion I referred him for needle biopsy in interventional radiology. This apparently has not yet been done.  He is actually doing very well. Denies any change in dyspnea or functional capacity. No weight loss.     ROV 09/17/15 -- Patient has a history of tobacco use, right upper lobe spiculated mass. Cytology and pathology were benign on 07/12/15 bronchoscopy. He underwent repeat CT scan of the chest on 08/27/15 that I have personally reviewed And which shows Some inferior clearing and decrease in size but with some progressive partially cavitary process at the right apex appears to be most  consistent with infectious process.    ROV 10/31/15 -- is a follow-up visit for a right upper lobe cavitary and spiculated lesion that we have evaluated by bronchoscopy and have followed by serial CT scans of the chest. He has been treated with an month of Augmentin beginning 09/17/15. We then repeated his CT scan of the chest on 10/29/15 and I have reviewed > shows continued decrease in size of his dominant right upper lobe cavitary focus. His breathing improved on the augmentin. His cough has decreased significantly.   ROV 63/19/17 -- follow-up visit for history of tobacco use, COPD, and a right upper lobe cavitary lesion that we have followed with serial CT scans. He underwent bronchoscopy that was unrevealing. We then treated him with Augmentin for possible cavitary pneumonia/lung abscess. On CT scan from October there was a decrease in size of the right upper lobe lesion. He returns today after a repeat scan done 12/30/15 that I personally reviewed. This shows continued decrease in size and involution of the right upper lobe lesion.  He reports that her has been well until he developed URI sx about 5 days ago. He has nasal congestion, drainage, cough productive of purulent material, some blood tinged sputum.  He has had more dyspnea as well, using his SABA prn.   ROV 02/27/16 -- this is a follow-up visit for COPD and a right upper lobe cavitary lesion that we have been following with serial CT scans. He was treated initially with Augmentin. The lesion decreased in size. Last visit I treated him w levaquin and pred but he did not  improve. Since our last visit he has been seen for worsening sx, was changed to Augmentin. Then cx positive for [facility] >> started on ethambutol, rifampin, clarithromycin and Augmentin was stopped. He has seen some blood.   ROV 03/24/16 -- follow up for COPD, RUL cavitary lesion. Suspected that this was an abscess (anaerobic) but then he grew out [facility]. Currently on 3 drug therapy. Last  Ct chest was 12/17. He is tolerating well. His hemoptysis has resolved   Sensitive to clarithro, ethambutol, rifampin 02/28/16 report   ROV 07/08/16 -- patient has a history of COPD and a right upper lobe cavitary lesion initially treated as a lung abscess with Augmentin. 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 CT scan of the chest was performed on 07/06/16 and I have personally reviewed. This shows further decrease in size of his right upper lobe cavitary lesion, now 6.2 x 3.0 x 6.9 cm. Clustered right middle lobe and left lower lobe nodules are unchanged. There are some new nodular foci seen in the right lower .lobe. He tells me that he was hospitalized for dyspnea, AE-COPD and possible PNA 4/15-4/19/18. Then he was given Augmentin on two occasions, he just completed a two week course. He declines after he finishes it. We tried making a change to Anoro - he felt that Spiriva and Advair are better.    Review of Systems  Constitutional: Negative for fever and unexpected weight change.  HENT: Negative for congestion, dental problem, ear pain, nosebleeds, postnasal drip, rhinorrhea, sinus pressure, sneezing, sore throat and trouble swallowing.   Eyes: Negative for redness and itching.  Respiratory: Positive for cough. Negative for chest tightness, shortness of breath and wheezing.   Cardiovascular: Negative for palpitations and leg swelling.  Gastrointestinal: Negative for nausea and vomiting.  Genitourinary: Negative for dysuria.  Musculoskeletal: Negative for joint swelling.  Skin: Negative for rash.  Neurological: Negative for headaches.  Hematological: Does not bruise/bleed easily.  Psychiatric/Behavioral: Negative for dysphoric mood. The patient is not nervous/anxious.     Past Medical History:  Diagnosis Date  . Allergic rhinitis   . Emphysema of lung ([place])   . Hyperlipidemia      Family History  Problem  Relation Age of Onset  . Lung cancer Father      Social History   Social History  . Marital status: Married    Spouse name: N/A  . Number of children: N/A  . Years of education: N/A   Occupational History  . Not on file.   Social History Main Topics  . Smoking status: Current Every Day Smoker    Packs/day: 1.00    Years: 45.00    Types: Cigarettes  . Smokeless tobacco: Never Used  . Alcohol use No  . Drug use: No  . Sexual activity: Not on file   Other Topics Concern  . Not on file   Social History Narrative  . No narrative on file     No Known Allergies   Outpatient Medications Prior to Visit  Medication Sig Dispense Refill  . albuterol (PROAIR HFA) 108 (90 Base) MCG/ACT inhaler Inhale 2 puffs into the lungs every 6 (six) hours as needed for wheezing or shortness of breath.    Marland Kitchen aspirin 81 MG tablet Take 81 mg by mouth daily.    . Cholecalciferol (VITAMIN D3) 5000 units CAPS Take 1 capsule by mouth daily.    . clarithromycin (BIAXIN) 500 MG tablet Take 500  mg by mouth 2 (two) times daily.    Marland Kitchen ethambutol (MYAMBUTOL) 400 MG tablet Take 400 mg by mouth daily. Take 3 tablets daily    . fluticasone-salmeterol (ADVAIR HFA) 230-21 MCG/ACT inhaler Inhale 2 puffs into the lungs 2 (two) times daily.    . folic acid (FOLVITE) 1 MG tablet Take 1 mg by mouth 2 (two) times daily.    Marland Kitchen loratadine-pseudoephedrine (CLARITIN-D 24 HOUR) 10-240 MG 24 hr tablet Take 1 tablet by mouth daily.    . ondansetron (ZOFRAN-ODT) 4 MG disintegrating tablet Take 4 mg by mouth every 8 (eight) hours as needed for nausea or vomiting.    . rifampin (RIFADIN) 300 MG capsule Take 300 mg by mouth daily. Take 2 capsules daily    . rosuvastatin (CRESTOR) 5 MG tablet Take 5 mg by mouth daily.    . tadalafil (CIALIS) 5 MG tablet Take 5 mg by mouth daily as needed for erectile dysfunction.    . Testosterone (AXIRON) 30 MG/ACT SOLN Place 1 application onto the skin daily.    Marland Kitchen tiotropium (SPIRIVA) 18 MCG  inhalation capsule Place 18 mcg into inhaler and inhale daily.    . vitamin B-12 (CYANOCOBALAMIN) 1000 MCG tablet Take 1,000 mcg by mouth daily.    Marland Kitchen umeclidinium-vilanterol (ANORO ELLIPTA) 62.5-25 MCG/INH AEPB Inhale 1 puff into the lungs daily. 60 each 5   No facility-administered medications prior to visit.          Objective:   Physical Exam Vitals:   07/08/16 1540  BP: 102/68  Pulse: 95  SpO2: 97%  Weight: 177 lb (80.3 kg)  Height: _0  (1.854 m)   Gen: Pleasant, well-nourished, in no distress,  normal affect  ENT: No lesions,  mouth clear,  oropharynx clear, no postnasal drip  Neck: No JVD, no TMG, no carotid bruits  Lungs: No use of accessory muscles, clear without rales or rhonchi  Cardiovascular: RRR, heart sounds normal, no murmur or gallops, no peripheral edema  Musculoskeletal: No deformities, no cyanosis or clubbing  Neuro: alert, non focal  Skin: Warm, no lesions or rashes      Assessment & Plan:  Mycobacterium avium complex ([place]) He has been on 3 drug therapy since 01/27/16. We will continue this for now. Recheck cultures for AFB if he is unable to produce sputum then we will likely need to perform bronchoscopy again.  COPD (chronic obstructive pulmonary disease) (HCC) Continue Spiriva and Advair. He found this to be superior to Anoro  Cavitating mass in right upper lung lobe Most consistent with a lung abscess. He has probable bacterial superinfection. He has been intermittently treated with Augmentin and has transiently improved. I like to get repeat bacterial cultures once the Augmentin is finished. Suspect that he will require repeat bronchoscopy. I may need to refer him to infectious diseases to help assist with long-term antibiotics if this is going to improve with medication alone. He may ultimately require referral for surgery.  Baltazar Apo, MD, PhD 07/08/2016, 4:10 PM [facility] [phone] or if no answer [phone]

## 2016-07-08 NOTE — Assessment & Plan Note (Signed)
Most consistent with a lung abscess. He has probable bacterial superinfection. He has been intermittently treated with Augmentin and has transiently improved. I like to get repeat bacterial cultures once the Augmentin is finished. Suspect that he will require repeat bronchoscopy. I may need to refer him to infectious diseases to help assist with long-term antibiotics if this is going to improve with medication alone. He may ultimately require referral for surgery.

## 2016-07-08 NOTE — Assessment & Plan Note (Signed)
Continue Spiriva and Advair. He found this to be superior to [employer]

## 2016-07-08 NOTE — Patient Instructions (Addendum)
Please continue your ethambutol, rifampin, clarithromycin as you have been taking them Finish your Augmentin today and then stop We will obtain good sputum samples for culture when you are off of the Augmentin.  If we are unable to get useful sputum samples then we will perform bronchoscopy to get a culture.  Please continue your Spiriva and Advair Follow with Dr Lamonte Sakai next available to review.

## 2016-08-06 ENCOUNTER — Other Ambulatory Visit: Payer: BC Managed Care – PPO

## 2016-08-06 DIAGNOSIS — J852 Abscess of lung without pneumonia: Secondary | ICD-10-CM

## 2016-08-09 LAB — RESPIRATORY CULTURE OR RESPIRATORY AND SPUTUM CULTURE
GRAM STAIN: NONE SEEN
Gram Stain: NONE SEEN
ORGANISM ID, BACTERIA: NORMAL

## 2016-08-20 ENCOUNTER — Encounter: Payer: Self-pay | Admitting: Emergency Medicine

## 2016-08-21 ENCOUNTER — Telehealth: Payer: Self-pay | Admitting: Emergency Medicine

## 2016-08-21 NOTE — Telephone Encounter (Signed)
Pt reached out via email requesting the results of his sputum cultures. Advised pt that the AFB culture is still pending. Pt is requesting the results of the bacterial sputum culture.   Nurses: when responding back to pt please use open MyChart encounter.  Dr. Lamonte Sakai please advise. Thanks.

## 2016-08-21 NOTE — Telephone Encounter (Signed)
Phone note sent to RB to results pt's bacterial sputum culture. Will need to respond to pt in this email encounter instead of calling pt.

## 2016-08-24 NOTE — Telephone Encounter (Signed)
Let him know that the sputum cx was negative for bacterial process (no evidence to support a lung abscess). Also his AFB is negative so far, final results still pending. We will nee dto gauge or suspicion for abscess and consider further workup, possibly bronchoscopy. We can discuss at his follow up OV

## 2016-08-24 NOTE — Telephone Encounter (Signed)
Spoke with pt over the phone.

## 2016-08-24 NOTE — Telephone Encounter (Signed)
Spoke with pt over the phone. I will close out the MyChart message. Nothing further was needed.

## 2016-09-11 ENCOUNTER — Encounter: Payer: Self-pay | Admitting: Emergency Medicine

## 2016-09-11 ENCOUNTER — Ambulatory Visit (INDEPENDENT_AMBULATORY_CARE_PROVIDER_SITE_OTHER): Payer: BC Managed Care – PPO | Admitting: Emergency Medicine

## 2016-09-11 DIAGNOSIS — J984 Other disorders of lung: Secondary | ICD-10-CM

## 2016-09-11 DIAGNOSIS — J449 Chronic obstructive pulmonary disease, unspecified: Secondary | ICD-10-CM | POA: Diagnosis not present

## 2016-09-11 NOTE — Assessment & Plan Note (Signed)
Cavitary RUL lesion, probable lung abscess. Smaller but has not cleared. I believe he needs repeat FOB for cx data. Will stop his [facility] coverage for the next 2 weeks and then perform BAL to help guide further abx therapy. I am concerned that he may ultimately need a lung resection.

## 2016-09-11 NOTE — Progress Notes (Signed)
Subjective:    Patient ID: Melvin White, male    DOB: 01-23-1955, 61 y.o.   MRN: [id number]  HPI 61 year old active smoker (45 pack years), with a history of allergic rhinitis, hypertension. Carries a diagnosis of COPD.  CT chest  05/30/15 > Show severe upper lobe predominant emphysema, speculated right upper lobe mass measuring 10 x 3 x 4.4 cm with some areas of necrotic change, also noted was some proximal esophageal thickening, some right lower lobe groundglass opacity of unclear significance. PET 06/12/15>  Significant hypermetabolism of the right upper lobe cavitary mass with an SUV of 11.3. Tiny clustered right mid lobe nodules with mild hypermetabolic activity no evidence of distant disease in the abdomen or bony skeleton.    ROV 07/08/16 -- patient has a history of COPD and a right upper lobe cavitary lesion initially treated as a lung abscess with Augmentin. 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 CT scan of the chest was performed on 07/06/16 and I have personally reviewed. This shows further decrease in size of his right upper lobe cavitary lesion, now 6.2 x 3.0 x 6.9 cm. Clustered right middle lobe and left lower lobe nodules are unchanged. There are some new nodular foci seen in the right lower .lobe. He tells me that he was hospitalized for dyspnea, AE-COPD and possible PNA 4/15-4/19/18. Then he was given Augmentin on two occasions, he just completed a two week course. He declines after he finishes it. We tried making a change to Anoro - he felt that Spiriva and Advair are better.   ROV 09/11/16 -- Patient with a history of COPD and complicated he scan of the chest with an apparent abscess, documented mycobacterial disease, other scattered foci of micronodular changes in the right middle lobe, left lower lobe, right lower lobe. He has been treated for [facility], also for presumed bacterial superinfection. He has  been off Augmentin since mid July. He took some prednisone given by PCP since last time. Remains on biaxin / rifampin/ ethambutol.    Review of Systems  Constitutional: Negative for fever and unexpected weight change.  HENT: Negative for congestion, dental problem, ear pain, nosebleeds, postnasal drip, rhinorrhea, sinus pressure, sneezing, sore throat and trouble swallowing.   Eyes: Negative for redness and itching.  Respiratory: Positive for cough. Negative for chest tightness, shortness of breath and wheezing.   Cardiovascular: Negative for palpitations and leg swelling.  Gastrointestinal: Negative for nausea and vomiting.  Genitourinary: Negative for dysuria.  Musculoskeletal: Negative for joint swelling.  Skin: Negative for rash.  Neurological: Negative for headaches.  Hematological: Does not bruise/bleed easily.  Psychiatric/Behavioral: Negative for dysphoric mood. The patient is not nervous/anxious.     Past Medical History:  Diagnosis Date  . Allergic rhinitis   . Emphysema of lung ([place])   . Hyperlipidemia      Family History  Problem Relation Age of Onset  . Lung cancer Father      Social History   Social History  . Marital status: Married    Spouse name: N/A  . Number of children: N/A  . Years of education: N/A   Occupational History  . Not on file.   Social History Main Topics  . Smoking status: Current Every Day Smoker    Packs/day: 1.00    Years: 45.00    Types: Cigarettes  . Smokeless tobacco: Never Used  . Alcohol use No  . Drug use: No  .  Sexual activity: Not on file   Other Topics Concern  . Not on file   Social History Narrative  . No narrative on file     No Known Allergies   Outpatient Medications Prior to Visit  Medication Sig Dispense Refill  . albuterol (PROAIR HFA) 108 (90 Base) MCG/ACT inhaler Inhale 2 puffs into the lungs every 6 (six) hours as needed for wheezing or shortness of breath.    Marland Kitchen aspirin 81 MG tablet Take 81 mg by  mouth daily.    . Cholecalciferol (VITAMIN D3) 5000 units CAPS Take 1 capsule by mouth daily.    . clarithromycin (BIAXIN) 500 MG tablet Take 500 mg by mouth 2 (two) times daily.    Marland Kitchen ethambutol (MYAMBUTOL) 400 MG tablet Take 400 mg by mouth daily. Take 3 tablets daily    . fluticasone-salmeterol (ADVAIR HFA) 230-21 MCG/ACT inhaler Inhale 2 puffs into the lungs 2 (two) times daily.    . folic acid (FOLVITE) 1 MG tablet Take 1 mg by mouth 2 (two) times daily.    Marland Kitchen loratadine-pseudoephedrine (CLARITIN-D 24 HOUR) 10-240 MG 24 hr tablet Take 1 tablet by mouth daily.    . ondansetron (ZOFRAN-ODT) 4 MG disintegrating tablet Take 4 mg by mouth every 8 (eight) hours as needed for nausea or vomiting.    . rifampin (RIFADIN) 300 MG capsule Take 300 mg by mouth daily. Take 2 capsules daily    . rosuvastatin (CRESTOR) 5 MG tablet Take 5 mg by mouth daily.    . tadalafil (CIALIS) 5 MG tablet Take 5 mg by mouth daily as needed for erectile dysfunction.    . Testosterone (AXIRON) 30 MG/ACT SOLN Place 1 application onto the skin daily.    Marland Kitchen tiotropium (SPIRIVA) 18 MCG inhalation capsule Place 18 mcg into inhaler and inhale daily.    . vitamin B-12 (CYANOCOBALAMIN) 1000 MCG tablet Take 1,000 mcg by mouth daily.     No facility-administered medications prior to visit.          Objective:   Physical Exam Vitals:   09/11/16 1116  BP: 134/74  Pulse: 94  SpO2: 98%  Weight: 182 lb (82.6 kg)  Height: _0  (1.854 m)   Gen: Pleasant, well-nourished, in no distress,  normal affect  ENT: No lesions,  mouth clear,  oropharynx clear, no postnasal drip  Neck: No JVD, no TMG, no carotid bruits  Lungs: No use of accessory muscles, clear without rales or rhonchi  Cardiovascular: RRR, heart sounds normal, no murmur or gallops, no peripheral edema  Musculoskeletal: No deformities, no cyanosis or clubbing  Neuro: alert, non focal  Skin: Warm, no lesions or rashes      Assessment & Plan:  Cavitating  mass in right upper lung lobe Cavitary RUL lesion, probable lung abscess. Smaller but has not cleared. I believe he needs repeat FOB for cx data. Will stop his [facility] coverage for the next 2 weeks and then perform BAL to help guide further abx therapy. I am concerned that he may ultimately need a lung resection.   COPD (chronic obstructive pulmonary disease) ([place]) Continue same BD's   Baltazar Apo, MD, PhD 09/11/2016, 11:54 AM [facility] [phone] or if no answer [phone]

## 2016-09-11 NOTE — Patient Instructions (Signed)
Please stop clarithromycin, rifampin, ethambutol now Continue your other medications as you have been taking them We will arrange for a bronchoscopy in approximately 2 weeks to obtain culture samples. Follow with Dr Lamonte Sakai next available.

## 2016-09-11 NOTE — Assessment & Plan Note (Signed)
Continue same BD's

## 2016-09-21 LAB — AFB CULTURE WITH SMEAR (NOT AT ARMC)

## 2016-09-23 ENCOUNTER — Telehealth: Payer: Self-pay | Admitting: Emergency Medicine

## 2016-09-23 NOTE — Telephone Encounter (Signed)
Spoke with Baxter Flattery. She states that pt will be be contacted by their department about the change. Nothing further was needed.

## 2016-09-24 ENCOUNTER — Encounter (HOSPITAL_COMMUNITY)
Admission: RE | Disposition: A | Discharge status: Home / Self Care | Payer: Self-pay | Source: Ambulatory Visit | Attending: Emergency Medicine

## 2016-09-24 ENCOUNTER — Inpatient Hospital Stay (HOSPITAL_COMMUNITY)
Admission: RE | Admit: 2016-09-24 | Discharge: 2016-09-24 | Disposition: A | Discharge status: Home / Self Care | Payer: BC Managed Care – PPO | Source: Ambulatory Visit

## 2016-09-24 ENCOUNTER — Ambulatory Visit (HOSPITAL_COMMUNITY)
Admission: RE | Admit: 2016-09-24 | Discharge: 2016-09-24 | Disposition: A | Discharge status: Home / Self Care | Payer: BC Managed Care – PPO | Source: Ambulatory Visit | Attending: Emergency Medicine | Admitting: Emergency Medicine

## 2016-09-24 ENCOUNTER — Encounter (HOSPITAL_COMMUNITY): Payer: Self-pay | Admitting: Respiratory Therapy

## 2016-09-24 DIAGNOSIS — A310 Pulmonary mycobacterial infection: Secondary | ICD-10-CM

## 2016-09-24 DIAGNOSIS — E785 Hyperlipidemia, unspecified: Secondary | ICD-10-CM | POA: Diagnosis not present

## 2016-09-24 DIAGNOSIS — F17210 Nicotine dependence, cigarettes, uncomplicated: Secondary | ICD-10-CM | POA: Insufficient documentation

## 2016-09-24 DIAGNOSIS — J984 Other disorders of lung: Secondary | ICD-10-CM | POA: Diagnosis not present

## 2016-09-24 DIAGNOSIS — Z7982 Long term (current) use of aspirin: Secondary | ICD-10-CM | POA: Diagnosis not present

## 2016-09-24 DIAGNOSIS — Z79899 Other long term (current) drug therapy: Secondary | ICD-10-CM | POA: Insufficient documentation

## 2016-09-24 DIAGNOSIS — I10 Essential (primary) hypertension: Secondary | ICD-10-CM | POA: Diagnosis not present

## 2016-09-24 DIAGNOSIS — R911 Solitary pulmonary nodule: Secondary | ICD-10-CM | POA: Insufficient documentation

## 2016-09-24 DIAGNOSIS — J439 Emphysema, unspecified: Secondary | ICD-10-CM | POA: Diagnosis not present

## 2016-09-24 HISTORY — PX: VIDEO BRONCHOSCOPY: SHX5072

## 2016-09-24 LAB — BODY FLUID CELL COUNT WITH DIFFERENTIAL
Lymphs, Fluid: 11 %
Monocyte-Macrophage-Serous Fluid: 3 % — ABNORMAL LOW (ref 50–90)
Neutrophil Count, Fluid: 86 % — ABNORMAL HIGH (ref 0–25)
Total Nucleated Cell Count, Fluid: 218 cu mm (ref 0–1000)

## 2016-09-24 SURGERY — VIDEO BRONCHOSCOPY WITHOUT FLUORO
Anesthesia: Moderate Sedation | Laterality: Bilateral

## 2016-09-24 MED ORDER — MIDAZOLAM HCL 5 MG/ML IJ SOLN
INTRAMUSCULAR | Status: AC
  Filled 2016-09-24: qty 2

## 2016-09-24 MED ORDER — PHENYLEPHRINE HCL 0.25 % NA SOLN
1.0000 | Freq: Four times a day (QID) | NASAL | Status: DC | PRN
  Filled 2016-09-24: qty 15

## 2016-09-24 MED ORDER — LIDOCAINE HCL 2 % EX GEL
CUTANEOUS | Status: DC | PRN
  Administered 2016-09-24: 1

## 2016-09-24 MED ORDER — LIDOCAINE HCL (PF) 1 % IJ SOLN
INTRAMUSCULAR | Status: DC | PRN
  Administered 2016-09-24: 6 mL

## 2016-09-24 MED ORDER — FENTANYL CITRATE (PF) 100 MCG/2ML IJ SOLN
INTRAMUSCULAR | Status: AC
  Filled 2016-09-24: qty 4

## 2016-09-24 MED ORDER — SODIUM CHLORIDE 0.9 % IV SOLN
INTRAVENOUS | Status: DC
  Administered 2016-09-24: 13:00:00 via INTRAVENOUS

## 2016-09-24 MED ORDER — PHENYLEPHRINE HCL 0.25 % NA SOLN
NASAL | Status: DC | PRN
  Administered 2016-09-24: 2 via NASAL

## 2016-09-24 MED ORDER — FENTANYL CITRATE (PF) 100 MCG/2ML IJ SOLN
INTRAMUSCULAR | Status: DC | PRN
  Administered 2016-09-24: 50 ug via INTRAVENOUS
  Administered 2016-09-24: 25 ug via INTRAVENOUS

## 2016-09-24 MED ORDER — MIDAZOLAM HCL 10 MG/2ML IJ SOLN
INTRAMUSCULAR | Status: DC | PRN
  Administered 2016-09-24: 1 mg via INTRAVENOUS
  Administered 2016-09-24: 3 mg via INTRAVENOUS

## 2016-09-24 MED ORDER — LIDOCAINE HCL 2 % EX GEL: 1.0000 "application " | Freq: Once | CUTANEOUS | Status: DC

## 2016-09-24 NOTE — Op Note (Signed)
[facility] Cardiop[facility] Patient Name: Melvin White Pocedure Date: 09/24/2016 MRN: [id number] Attending MD: Collene Gobble , MD Date of Birth: April 16, 1955 CSN: Finalized Age: 61 Admit Type: Outpatient Gender: Male Procedure:            Bronchoscopy Indications:          Suspicious right upper lobe lesion, Unresolving right                        upper lobe infiltrate Providers:            Collene Gobble, MD, Cherre Huger RRT, RCP, Ashley Mariner                        RRT,RCP Referring MD:          Medicines:            Midazolam 4 mg IV, Fentanyl 75 mcg IV, Lidocaine 1%                        applied to cords 8 mL, Lidocaine 1% applied to the                        tracheobronchial tree 8 mL Complications:        No immediate complications Estimated Blood Loss: Estimated blood loss: none. Procedure:            Pre-Anesthesia Assessment:                       - A History and Physical has been performed. Patient                        meds and allergies have been reviewed. The risks and                        benefits of the procedure and the sedation options and                        risks were discussed with the patient. All questions                        were answered and informed consent was obtained.                        Patient identification and proposed procedure were                        verified prior to the procedure by the physician in the                        procedure room. Mental Status Examination: alert and                        oriented. Airway Examination: normal oropharyngeal                        airway. Respiratory Examination: clear to auscultation.                        CV Examination: normal. ASA Grade Assessment: I - A  normal healthy patient. After reviewing the risks and                        benefits, the patient was deemed in satisfactory                        condition to undergo the procedure. The  anesthesia plan                        was to use moderate sedation / analgesia (conscious                        sedation). Immediately prior to administration of                        medications, the patient was re-assessed for adequacy                        to receive sedatives. The heart rate, respiratory rate,                        oxygen saturations, blood pressure, adequacy of                        pulmonary ventilation, and response to care were                        monitored throughout the procedure. The physical status                        of the patient was re-assessed after the procedure.                       After obtaining informed consent, the bronchoscope was                        passed under direct vision. Throughout the procedure,                        the patient's blood pressure, pulse, and oxygen                        saturations were monitored continuously. the [id number]                        [id number] scope was introduced through the left nostril                        and advanced to the tracheobronchial tree. The                        procedure was accomplished without difficulty. The                        patient tolerated the procedure well. Scope In: 1:16:37 PM Scope Out: 1:23:41 PM Findings:      The nasopharynx/oropharynx appears normal. The larynx appears normal.       The vocal cords appear normal. The subglottic space is normal. The       trachea is of normal caliber. The carina is  sharp. The tracheobronchial       tree was examined to at least the first subsegmental level. Bronchial       mucosa and anatomy are normal; there are no endobronchial lesions, and       no secretions. The bronchus intermedius was somewhat collapsible with       inspiration, but no overt lesion.      Bronchoalveolar lavage was performed in the RUL apical segment (B1) of       the lung and sent for cell count, bacterial culture, viral smears &       culture, and  fungal & AFB analysis and cytology. 60 mL of fluid were       instilled. 20 mL were returned. The return was cloudy. There were no       mucoid plugs in the return fluid. Impression:           - Suspicious right upper lobe lesion                       - Unresolving right upper lobe infiltrate                       - The examination was normal.                       - Bronchoalveolar lavage was performed. Moderate Sedation:      Moderate (conscious) sedation was personally administered by the       endoscopist. The following parameters were monitored: oxygen saturation,       heart rate, blood pressure, respiratory rate, EKG, adequacy of pulmonary       ventilation, and response to care. Total physician intraservice time was       14 minutes. Recommendation:       - Await BAL results. Procedure Code(s):    --- Professional ---                       [phone], Bronchoscopy, rigid or flexible, including                        fluoroscopic guidance, when performed; with bronchial                        alveolar lavage                       99152, Moderate sedation services provided by the same                        physician or other qualified health care professional                        performing the diagnostic or therapeutic service that                        the sedation supports, requiring the presence of an                        independent trained observer to assist in the                        monitoring of the patient's level of consciousness and  physiological status; initial 15 minutes of                        intraservice time, patient age 39 years or older Diagnosis Code(s):    --- Professional ---                       R91.8, Other nonspecific abnormal finding of lung field CPT copyright 2016 American Medical Association. All rights reserved. The codes documented in this report are preliminary and upon coder review may  be revised to meet current  compliance requirements. Collene Gobble, MD Collene Gobble, MD 09/24/2016 1:38:38 PM Number of Addenda: 0

## 2016-09-24 NOTE — Discharge Instructions (Signed)
Flexible Bronchoscopy, Care After These instructions give you information on caring for yourself after your procedure. Your doctor may also give you more specific instructions. Call your doctor if you have any problems or questions after your procedure. Follow these instructions at home:  Do not eat or drink anything for 2 hours after your procedure. If you try to eat or drink before the medicine wears off, food or drink could go into your lungs. You could also burn yourself.  After 2 hours have passed and when you can cough and gag normally, you may eat soft food and drink liquids slowly.  The day after the test, you may eat your normal diet.  You may do your normal activities.  Keep all doctor visits. Get help right away if:  You get more and more short of breath.  You get light-headed.  You feel like you are going to pass out (faint).  You have chest pain.  You have new problems that worry you.  You cough up more than a little blood.  You cough up more blood than before. This information is not intended to replace advice given to you by your health care provider. Make sure you discuss any questions you have with your health care provider. Document Released: 10/26/2008 Document Revised: 06/06/2015 Document Reviewed: 09/02/2012 Elsevier Interactive Patient Education  2017 [place] not eat or drink until after 3:30 today 09/24/16  CALL OUR OFFICE FOR ANY QUESTIONS OR CONCERNS [phone]

## 2016-09-24 NOTE — Progress Notes (Signed)
Video bronchoscopy performed Intervention bronchial washings Pt tolerated well  Venida Tsukamoto David RRT  

## 2016-09-24 NOTE — Interval H&P Note (Signed)
PCCM Interval Note  Pt presents today for FOB + BAL to obtain cx data in setting RUL abscess, bilateral infiltrates. He understands the procedure, risks, benefits. All questions answered. wil proceed as planned.    Baltazar Apo, MD, PhD 09/24/2016, 1:12 PM [facility] [phone] or if no answer [phone]

## 2016-09-24 NOTE — H&P (View-Only) (Signed)
Subjective:    Patient ID: Melvin White, male    DOB: [birth year], 61 y.o.   MRN: [id number]  HPI 61 year old active smoker (45 pack years), with a history of allergic rhinitis, hypertension. Carries a diagnosis of COPD.  CT chest  05/30/15 > Show severe upper lobe predominant emphysema, speculated right upper lobe mass measuring 10 x 3 x 4.4 cm with some areas of necrotic change, also noted was some proximal esophageal thickening, some right lower lobe groundglass opacity of unclear significance. PET 06/12/15>  Significant hypermetabolism of the right upper lobe cavitary mass with an SUV of 11.3. Tiny clustered right mid lobe nodules with mild hypermetabolic activity no evidence of distant disease in the abdomen or bony skeleton.    ROV 07/08/16 -- patient has a history of COPD and a right upper lobe cavitary lesion initially treated as a lung abscess with Augmentin. 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 CT scan of the chest was performed on 07/06/16 and I have personally reviewed. This shows further decrease in size of his right upper lobe cavitary lesion, now 6.2 x 3.0 x 6.9 cm. Clustered right middle lobe and left lower lobe nodules are unchanged. There are some new nodular foci seen in the right lower .lobe. He tells me that he was hospitalized for dyspnea, AE-COPD and possible PNA 4/15-4/19/18. Then he was given Augmentin on two occasions, he just completed a two week course. He declines after he finishes it. We tried making a change to Anoro - he felt that Spiriva and Advair are better.   ROV 09/11/16 -- Patient with a history of COPD and complicated he scan of the chest with an apparent abscess, documented mycobacterial disease, other scattered foci of micronodular changes in the right middle lobe, left lower lobe, right lower lobe. He has been treated for [facility], also for presumed bacterial superinfection. He has  been off Augmentin since mid July. He took some prednisone given by PCP since last time. Remains on biaxin / rifampin/ ethambutol.    Review of Systems  Constitutional: Negative for fever and unexpected weight change.  HENT: Negative for congestion, dental problem, ear pain, nosebleeds, postnasal drip, rhinorrhea, sinus pressure, sneezing, sore throat and trouble swallowing.   Eyes: Negative for redness and itching.  Respiratory: Positive for cough. Negative for chest tightness, shortness of breath and wheezing.   Cardiovascular: Negative for palpitations and leg swelling.  Gastrointestinal: Negative for nausea and vomiting.  Genitourinary: Negative for dysuria.  Musculoskeletal: Negative for joint swelling.  Skin: Negative for rash.  Neurological: Negative for headaches.  Hematological: Does not bruise/bleed easily.  Psychiatric/Behavioral: Negative for dysphoric mood. The patient is not nervous/anxious.     Past Medical History:  Diagnosis Date  . Allergic rhinitis   . Emphysema of lung ([place])   . Hyperlipidemia      Family History  Problem Relation Age of Onset  . Lung cancer Father      Social History   Social History  . Marital status: Married    Spouse name: N/A  . Number of children: N/A  . Years of education: N/A   Occupational History  . Not on file.   Social History Main Topics  . Smoking status: Current Every Day Smoker    Packs/day: 1.00    Years: 45.00    Types: Cigarettes  . Smokeless tobacco: Never Used  . Alcohol use No  . Drug use: No  .  Sexual activity: Not on file   Other Topics Concern  . Not on file   Social History Narrative  . No narrative on file     No Known Allergies   Outpatient Medications Prior to Visit  Medication Sig Dispense Refill  . albuterol (PROAIR HFA) 108 (90 Base) MCG/ACT inhaler Inhale 2 puffs into the lungs every 6 (six) hours as needed for wheezing or shortness of breath.    Marland Kitchen aspirin 81 MG tablet Take 81 mg by  mouth daily.    . Cholecalciferol (VITAMIN D3) 5000 units CAPS Take 1 capsule by mouth daily.    . clarithromycin (BIAXIN) 500 MG tablet Take 500 mg by mouth 2 (two) times daily.    Marland Kitchen ethambutol (MYAMBUTOL) 400 MG tablet Take 400 mg by mouth daily. Take 3 tablets daily    . fluticasone-salmeterol (ADVAIR HFA) 230-21 MCG/ACT inhaler Inhale 2 puffs into the lungs 2 (two) times daily.    . folic acid (FOLVITE) 1 MG tablet Take 1 mg by mouth 2 (two) times daily.    Marland Kitchen loratadine-pseudoephedrine (CLARITIN-D 24 HOUR) 10-240 MG 24 hr tablet Take 1 tablet by mouth daily.    . ondansetron (ZOFRAN-ODT) 4 MG disintegrating tablet Take 4 mg by mouth every 8 (eight) hours as needed for nausea or vomiting.    . rifampin (RIFADIN) 300 MG capsule Take 300 mg by mouth daily. Take 2 capsules daily    . rosuvastatin (CRESTOR) 5 MG tablet Take 5 mg by mouth daily.    . tadalafil (CIALIS) 5 MG tablet Take 5 mg by mouth daily as needed for erectile dysfunction.    . Testosterone (AXIRON) 30 MG/ACT SOLN Place 1 application onto the skin daily.    Marland Kitchen tiotropium (SPIRIVA) 18 MCG inhalation capsule Place 18 mcg into inhaler and inhale daily.    . vitamin B-12 (CYANOCOBALAMIN) 1000 MCG tablet Take 1,000 mcg by mouth daily.     No facility-administered medications prior to visit.          Objective:   Physical Exam Vitals:   09/11/16 1116  BP: 134/74  Pulse: 94  SpO2: 98%  Weight: 182 lb (82.6 kg)  Height: _0  (1.854 m)   Gen: Pleasant, well-nourished, in no distress,  normal affect  ENT: No lesions,  mouth clear,  oropharynx clear, no postnasal drip  Neck: No JVD, no TMG, no carotid bruits  Lungs: No use of accessory muscles, clear without rales or rhonchi  Cardiovascular: RRR, heart sounds normal, no murmur or gallops, no peripheral edema  Musculoskeletal: No deformities, no cyanosis or clubbing  Neuro: alert, non focal  Skin: Warm, no lesions or rashes      Assessment & Plan:  Cavitating  mass in right upper lung lobe Cavitary RUL lesion, probable lung abscess. Smaller but has not cleared. I believe he needs repeat FOB for cx data. Will stop his [facility] coverage for the next 2 weeks and then perform BAL to help guide further abx therapy. I am concerned that he may ultimately need a lung resection.   COPD (chronic obstructive pulmonary disease) ([place]) Continue same BD's   Baltazar Apo, MD, PhD 09/11/2016, 11:54 AM [facility] [phone] or if no answer [phone]

## 2016-09-25 ENCOUNTER — Encounter (HOSPITAL_COMMUNITY): Payer: BC Managed Care – PPO

## 2016-09-25 LAB — ACID FAST SMEAR (AFB): ACID FAST SMEAR - AFSCU2: NEGATIVE

## 2016-09-25 LAB — ACID FAST SMEAR (AFB, MYCOBACTERIA)

## 2016-09-26 LAB — CULTURE, BAL-QUANTITATIVE: SPECIAL REQUESTS: NORMAL

## 2016-09-26 LAB — CULTURE, BAL-QUANTITATIVE W GRAM STAIN: Culture: 80000 — AB

## 2016-10-20 ENCOUNTER — Ambulatory Visit: Payer: BC Managed Care – PPO | Admitting: Emergency Medicine

## 2016-10-20 LAB — FUNGAL ORGANISM REFLEX

## 2016-10-20 LAB — FUNGUS CULTURE RESULT

## 2016-10-20 LAB — FUNGUS CULTURE WITH STAIN

## 2016-10-22 ENCOUNTER — Encounter: Payer: Self-pay | Admitting: Emergency Medicine

## 2016-10-22 ENCOUNTER — Ambulatory Visit (INDEPENDENT_AMBULATORY_CARE_PROVIDER_SITE_OTHER): Payer: BC Managed Care – PPO | Admitting: Emergency Medicine

## 2016-10-22 VITALS — BP 124/78 | HR 76 | Ht 72.0 in | Wt 181.2 lb

## 2016-10-22 DIAGNOSIS — J984 Other disorders of lung: Secondary | ICD-10-CM

## 2016-10-22 DIAGNOSIS — A310 Pulmonary mycobacterial infection: Secondary | ICD-10-CM

## 2016-10-22 DIAGNOSIS — R911 Solitary pulmonary nodule: Secondary | ICD-10-CM | POA: Diagnosis not present

## 2016-10-22 NOTE — Assessment & Plan Note (Signed)
Discussed cessation in more detail with him today. Talked about strategies to cut down and then hopefully date at some point in the near future.

## 2016-10-22 NOTE — Progress Notes (Signed)
Subjective:    Patient ID: Melvin White, male    DOB: 24-May-1955, 61 y.o.   MRN: [id number]  HPI 61 year old active smoker (45 pack years), with a history of allergic rhinitis, hypertension. Carries a diagnosis of COPD.  CT chest  05/30/15 > Show severe upper lobe predominant emphysema, speculated right upper lobe mass measuring 10 x 3 x 4.4 cm with some areas of necrotic change, also noted was some proximal esophageal thickening, some right lower lobe groundglass opacity of unclear significance. PET 06/12/15>  Significant hypermetabolism of the right upper lobe cavitary mass with an SUV of 11.3. Tiny clustered right mid lobe nodules with mild hypermetabolic activity no evidence of distant disease in the abdomen or bony skeleton.    ROV 07/08/16 -- patient has a history of COPD and a right upper lobe cavitary lesion initially treated as a lung abscess with Augmentin. 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 CT scan of the chest was performed on 07/06/16 and I have personally reviewed. This shows further decrease in size of his right upper lobe cavitary lesion, now 6.2 x 3.0 x 6.9 cm. Clustered right middle lobe and left lower lobe nodules are unchanged. There are some new nodular foci seen in the right lower .lobe. He tells me that he was hospitalized for dyspnea, AE-COPD and possible PNA 4/15-4/19/18. Then he was given Augmentin on two occasions, he just completed a two week course. He declines after he finishes it. We tried making a change to Anoro - he felt that Spiriva and Advair are better.   ROV 09/11/16 -- Patient with a history of COPD and complicated he scan of the chest with an apparent abscess, documented mycobacterial disease, other scattered foci of micronodular changes in the right middle lobe, left lower lobe, right lower lobe. He has been treated for [facility], also for presumed bacterial superinfection. He has  been off Augmentin since mid July. He took some prednisone given by PCP since last time. Remains on biaxin / rifampin/ ethambutol.   ROV 10/22/16 -- pleasant 61 year old man with tobacco use and COPD. He has a complicated CT scan of his chest with severe emphysematous change, a right upper lobe cavitary lesion that has been decreasing in size on serial scans other nodular foci. He has been treated with prolonged Augmentin for probable lung abscesses. Also treated for [facility] after culture data revealed this. I stopped his Biaxin, rifampin, ethambutol early September and then he had bronchoscopy on 09/24/16. There were no predominant bacterial organisms, 86% neutrophils on cell count, negative cytology. AFB and and fungal stains negative, fungal culture did show some Candida. AFB culture is pending but still negative. He restarted the [facility] rx after the FOB, on it currently. He is on Advair and Spiriva.    Review of Systems  Constitutional: Negative for fever and unexpected weight change.  HENT: Negative for congestion, dental problem, ear pain, nosebleeds, postnasal drip, rhinorrhea, sinus pressure, sneezing, sore throat and trouble swallowing.   Eyes: Negative for redness and itching.  Respiratory: Positive for cough. Negative for chest tightness, shortness of breath and wheezing.   Cardiovascular: Negative for palpitations and leg swelling.  Gastrointestinal: Negative for nausea and vomiting.  Genitourinary: Negative for dysuria.  Musculoskeletal: Negative for joint swelling.  Skin: Negative for rash.  Neurological: Negative for headaches.  Hematological: Does not bruise/bleed easily.  Psychiatric/Behavioral: Negative for dysphoric mood. The patient is not nervous/anxious.     Past  Medical History:  Diagnosis Date  . Allergic rhinitis   . Emphysema of lung ([place])   . Hyperlipidemia      Family History  Problem Relation Age of Onset  . Lung cancer Father      Social History   Social  History  . Marital status: Married    Spouse name: N/A  . Number of children: N/A  . Years of education: N/A   Occupational History  . Not on file.   Social History Main Topics  . Smoking status: Current Every Day Smoker    Packs/day: 1.00    Years: 45.00    Types: Cigarettes  . Smokeless tobacco: Never Used  . Alcohol use No  . Drug use: No  . Sexual activity: Not on file   Other Topics Concern  . Not on file   Social History Narrative  . No narrative on file     No Known Allergies   Outpatient Medications Prior to Visit  Medication Sig Dispense Refill  . albuterol (PROAIR HFA) 108 (90 Base) MCG/ACT inhaler Inhale 2 puffs into the lungs every 6 (six) hours as needed for wheezing or shortness of breath.    Marland Kitchen aspirin 81 MG tablet Take 81 mg by mouth daily.    . Cholecalciferol (VITAMIN D3) 5000 units CAPS Take 1 capsule by mouth daily.    . clarithromycin (BIAXIN) 500 MG tablet Take 500 mg by mouth 2 (two) times daily.    Marland Kitchen ethambutol (MYAMBUTOL) 400 MG tablet Take 400 mg by mouth daily. Take 3 tablets daily    . fluticasone-salmeterol (ADVAIR HFA) 230-21 MCG/ACT inhaler Inhale 2 puffs into the lungs 2 (two) times daily.    . folic acid (FOLVITE) 1 MG tablet Take 1 mg by mouth 2 (two) times daily.    Marland Kitchen loratadine-pseudoephedrine (CLARITIN-D 24 HOUR) 10-240 MG 24 hr tablet Take 1 tablet by mouth daily.    . ondansetron (ZOFRAN-ODT) 4 MG disintegrating tablet Take 4 mg by mouth every 8 (eight) hours as needed for nausea or vomiting.    . rifampin (RIFADIN) 300 MG capsule Take 300 mg by mouth daily. Take 2 capsules daily    . rosuvastatin (CRESTOR) 5 MG tablet Take 5 mg by mouth daily.    . tadalafil (CIALIS) 5 MG tablet Take 5 mg by mouth daily as needed for erectile dysfunction.    . Testosterone (AXIRON) 30 MG/ACT SOLN Place 1 application onto the skin daily.    Marland Kitchen tiotropium (SPIRIVA) 18 MCG inhalation capsule Place 18 mcg into inhaler and inhale daily.    . vitamin B-12  (CYANOCOBALAMIN) 1000 MCG tablet Take 1,000 mcg by mouth daily.     No facility-administered medications prior to visit.          Objective:   Physical Exam Vitals:   10/22/16 1216  BP: 124/78  Pulse: 76  SpO2: 95%  Weight: 181 lb 4 oz (82.2 kg)  Height: 6' (1.829 m)   Gen: Pleasant, well-nourished, in no distress,  normal affect  ENT: No lesions,  mouth clear,  oropharynx clear, no postnasal drip  Neck: No JVD, no TMG, no carotid bruits  Lungs: No use of accessory muscles, clear without rales or rhonchi  Cardiovascular: RRR, heart sounds normal, no murmur or gallops, no peripheral edema  Musculoskeletal: No deformities, no cyanosis or clubbing  Neuro: alert, non focal  Skin: Warm, no lesions or rashes      Assessment & Plan:  COPD (chronic obstructive pulmonary disease) (  HCC) Severe disease. I suspect that it is causing some of his daily symptoms, he had an episode 2 months ago that sounded more consistent with a COPD exacerbation than progression of his lung abscess.  He is on Anoro, uses albuterol frequently. Discussed smoking today.   Tobacco abuse Discussed cessation in more detail with him today. Talked about strategies to cut down and then hopefully date at some point in the near future.  Cavitating mass in right upper lung lobe Remains present on imaging. He stopped Augmentin in July. He has been on therapy for [facility] since January. We repeated his bronchoscopy and AFB is negative so far. I believe we can stop the clarithromycin, rifampin, ethambutol. He may ultimately need to be back on Augmentin. He is a poor candidate for surgical resection and I'm trying to avoid this if at all possible. Repeat CT scan of the chest to look for interval change. Based on the appearance of the abscess we will decide about continued antibiotics.  Baltazar Apo, MD, PhD 10/22/2016, 12:48 PM [facility] [phone] or if no answer [phone]

## 2016-10-22 NOTE — Assessment & Plan Note (Signed)
Severe disease. I suspect that it is causing some of his daily symptoms, he had an episode 2 months ago that sounded more consistent with a COPD exacerbation than progression of his lung abscess.  He is on Anoro, uses albuterol frequently. Discussed smoking today.

## 2016-10-22 NOTE — Patient Instructions (Signed)
We will stop clarithromycin, rifampin, ethambutol now based on negative cultures.  We will repeat your CT scan of the chest to look for interval change in your lung abscess.  You will likely need to restart Augmentin in the future. We will decide based on your CT scan.  You need to stop smoking as soon as possible.  Continue Anoro once a day Use albuterol, either 2 puffs or by your nebulizer, up to every 4 hours if needed for shortness of breath.  Follow with Dr Lamonte Sakai next available after your CT to review the results.

## 2016-10-22 NOTE — Assessment & Plan Note (Addendum)
Remains present on imaging. He stopped Augmentin in July. He has been on therapy for [facility] since January. We repeated his bronchoscopy and AFB is negative so far. I believe we can stop the clarithromycin, rifampin, ethambutol. He may ultimately need to be back on Augmentin. He is a poor candidate for surgical resection and I'm trying to avoid this if at all possible. Repeat CT scan of the chest to look for interval change. Based on the appearance of the abscess we will decide about continued antibiotics.

## 2016-10-27 ENCOUNTER — Ambulatory Visit (INDEPENDENT_AMBULATORY_CARE_PROVIDER_SITE_OTHER)
Admission: RE | Admit: 2016-10-27 | Discharge: 2016-10-27 | Disposition: A | Discharge status: Home / Self Care | Payer: BC Managed Care – PPO | Source: Ambulatory Visit | Attending: Emergency Medicine | Admitting: Emergency Medicine

## 2016-10-27 DIAGNOSIS — R911 Solitary pulmonary nodule: Secondary | ICD-10-CM | POA: Diagnosis not present

## 2016-10-27 DIAGNOSIS — A310 Pulmonary mycobacterial infection: Secondary | ICD-10-CM

## 2016-11-05 ENCOUNTER — Encounter: Payer: Self-pay | Admitting: Emergency Medicine

## 2016-11-05 ENCOUNTER — Ambulatory Visit (INDEPENDENT_AMBULATORY_CARE_PROVIDER_SITE_OTHER): Payer: BC Managed Care – PPO | Admitting: Emergency Medicine

## 2016-11-05 DIAGNOSIS — J984 Other disorders of lung: Secondary | ICD-10-CM

## 2016-11-05 DIAGNOSIS — J449 Chronic obstructive pulmonary disease, unspecified: Secondary | ICD-10-CM | POA: Diagnosis not present

## 2016-11-05 MED ORDER — PREDNISONE 10 MG PO TABS: ORAL_TABLET | ORAL | 0 refills | Status: AC

## 2016-11-05 MED ORDER — AMOXICILLIN-POT CLAVULANATE 875-125 MG PO TABS: 1.0000 | ORAL_TABLET | Freq: Two times a day (BID) | ORAL | 5 refills | Status: AC

## 2016-11-05 NOTE — Progress Notes (Signed)
Subjective:    Patient ID: Melvin White, male    DOB: 1955-01-27, 61 y.o.   MRN: [id number]  HPI 61 year old active smoker (45 pack years), with a history of allergic rhinitis, hypertension. Carries a diagnosis of COPD.  CT chest  05/30/15 > Show severe upper lobe predominant emphysema, speculated right upper lobe mass measuring 10 x 3 x 4.4 cm with some areas of necrotic change, also noted was some proximal esophageal thickening, some right lower lobe groundglass opacity of unclear significance. PET 06/12/15>  Significant hypermetabolism of the right upper lobe cavitary mass with an SUV of 11.3. Tiny clustered right mid lobe nodules with mild hypermetabolic activity no evidence of distant disease in the abdomen or bony skeleton.    ROV 07/08/16 -- patient has a history of COPD and a right upper lobe cavitary lesion initially treated as a lung abscess with Augmentin. 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 CT scan of the chest was performed on 07/06/16 and I have personally reviewed. This shows further decrease in size of his right upper lobe cavitary lesion, now 6.2 x 3.0 x 6.9 cm. Clustered right middle lobe and left lower lobe nodules are unchanged. There are some new nodular foci seen in the right lower .lobe. He tells me that he was hospitalized for dyspnea, AE-COPD and possible PNA 4/15-4/19/18. Then he was given Augmentin on two occasions, he just completed a two week course. He declines after he finishes it. We tried making a change to Anoro - he felt that Spiriva and Advair are better.   ROV 09/11/16 -- Patient with a history of COPD and complicated he scan of the chest with an apparent abscess, documented mycobacterial disease, other scattered foci of micronodular changes in the right middle lobe, left lower lobe, right lower lobe. He has been treated for [facility], also for presumed bacterial superinfection. He has  been off Augmentin since mid July. He took some prednisone given by PCP since last time. Remains on biaxin / rifampin/ ethambutol.   ROV 10/22/16 -- pleasant 61 year old man with tobacco use and COPD. He has a complicated CT scan of his chest with severe emphysematous change, a right upper lobe cavitary lesion that has been decreasing in size on serial scans other nodular foci. He has been treated with prolonged Augmentin for probable lung abscesses. Also treated for [facility] after culture data revealed this. I stopped his Biaxin, rifampin, ethambutol early September and then he had bronchoscopy on 09/24/16. There were no predominant bacterial organisms, 86% neutrophils on cell count, negative cytology. AFB and and fungal stains negative, fungal culture did show some Candida. AFB culture is pending but still negative. He restarted the [facility] rx after the FOB, on it currently. He is on Advair and Spiriva.   ROV 11/05/16 --patient has a history of COPD, tobacco use.  He follows up today for further evaluation of his CT scan of the chest that has been characterized by a right upper lobe cavitary lesion, presumed lung abscess.  He has been treated with antibiotics for anaerobic organisms as well as for [facility].  With negative cultures after bronchoscopy I stopped his 3 drug therapy at last visit. He has had increase in purulent sputum since we stopped those antibiotics.  Repeat CT scan of the chest done on 10/28/16 shows a slight interval decrease in size of his right upper lobe cavitary lesion.    Review of Systems  Constitutional: Negative for  fever and unexpected weight change.  HENT: Negative for congestion, dental problem, ear pain, nosebleeds, postnasal drip, rhinorrhea, sinus pressure, sneezing, sore throat and trouble swallowing.   Eyes: Negative for redness and itching.  Respiratory: Positive for cough. Negative for chest tightness, shortness of breath and wheezing.   Cardiovascular: Negative for palpitations  and leg swelling.  Gastrointestinal: Negative for nausea and vomiting.  Genitourinary: Negative for dysuria.  Musculoskeletal: Negative for joint swelling.  Skin: Negative for rash.  Neurological: Negative for headaches.  Hematological: Does not bruise/bleed easily.  Psychiatric/Behavioral: Negative for dysphoric mood. The patient is not nervous/anxious.     Past Medical History:  Diagnosis Date  . Allergic rhinitis   . Emphysema of lung ([place])   . Hyperlipidemia      Family History  Problem Relation Age of Onset  . Lung cancer Father      Social History   Social History  . Marital status: Married    Spouse name: N/A  . Number of children: N/A  . Years of education: N/A   Occupational History  . Not on file.   Social History Main Topics  . Smoking status: Current Every Day Smoker    Packs/day: 1.00    Years: 45.00    Types: Cigarettes  . Smokeless tobacco: Never Used  . Alcohol use No  . Drug use: No  . Sexual activity: Not on file   Other Topics Concern  . Not on file   Social History Narrative  . No narrative on file     No Known Allergies   Outpatient Medications Prior to Visit  Medication Sig Dispense Refill  . albuterol (PROAIR HFA) 108 (90 Base) MCG/ACT inhaler Inhale 2 puffs into the lungs every 6 (six) hours as needed for wheezing or shortness of breath.    Marland Kitchen aspirin 81 MG tablet Take 81 mg by mouth daily.    . Cholecalciferol (VITAMIN D3) 5000 units CAPS Take 1 capsule by mouth daily.    . fluticasone-salmeterol (ADVAIR HFA) 230-21 MCG/ACT inhaler Inhale 2 puffs into the lungs 2 (two) times daily.    . folic acid (FOLVITE) 1 MG tablet Take 1 mg by mouth 2 (two) times daily.    Marland Kitchen loratadine-pseudoephedrine (CLARITIN-D 24 HOUR) 10-240 MG 24 hr tablet Take 1 tablet by mouth daily.    . ondansetron (ZOFRAN-ODT) 4 MG disintegrating tablet Take 4 mg by mouth every 8 (eight) hours as needed for nausea or vomiting.    . rosuvastatin (CRESTOR) 5 MG tablet  Take 5 mg by mouth daily.    . tadalafil (CIALIS) 5 MG tablet Take 5 mg by mouth daily as needed for erectile dysfunction.    . Testosterone (AXIRON) 30 MG/ACT SOLN Place 1 application onto the skin daily.    Marland Kitchen tiotropium (SPIRIVA) 18 MCG inhalation capsule Place 18 mcg into inhaler and inhale daily.    . vitamin B-12 (CYANOCOBALAMIN) 1000 MCG tablet Take 1,000 mcg by mouth daily.    . clarithromycin (BIAXIN) 500 MG tablet Take 500 mg by mouth 2 (two) times daily.    Marland Kitchen ethambutol (MYAMBUTOL) 400 MG tablet Take 400 mg by mouth daily. Take 3 tablets daily    . rifampin (RIFADIN) 300 MG capsule Take 300 mg by mouth daily. Take 2 capsules daily     No facility-administered medications prior to visit.          Objective:   Physical Exam Vitals:   11/05/16 1623  BP: 126/84  Pulse: 97  SpO2: 93%  Weight: 178 lb (80.7 kg)  Height: 6' (1.829 m)   Gen: Pleasant, well-nourished, in no distress,  normal affect  ENT: No lesions,  mouth clear,  oropharynx clear, no postnasal drip  Neck: No JVD, no TMG, no carotid bruits  Lungs: No use of accessory muscles, clear without rales or rhonchi  Cardiovascular: RRR, heart sounds normal, no murmur or gallops, no peripheral edema  Musculoskeletal: No deformities, no cyanosis or clubbing  Neuro: alert, non focal  Skin: Warm, no lesions or rashes      Assessment & Plan:  Cavitating mass in right upper lung lobe Slightly smaller by CT scan compared with June while he has been on clarithromycin, rifampin, ethambutol.  It still not clear to me whether the abscess is going to fully resolve but he is a poor candidate for lung resection due to his obstructive lung disease.  I would like to restart him on antibiotics.  Consider Levaquin but I do not want him to develop a resistant Pseudomonas.  He has benefited from Augmentin in the past, does not have gram negatives on Gram stain so I will restart the Augmentin and follow him for several months.  He  will need reimaging CT scan at that time  COPD (chronic obstructive pulmonary disease) ([place]) With an increase in his daily sputum production, cough, symptoms since his suppressive antibiotics were stopped.  I will treat with a prednisone taper.  Continue his same bronchodilators.  Baltazar Apo, MD, PhD 11/05/2016, 5:10 PM [facility] [phone] or if no answer [phone]

## 2016-11-05 NOTE — Assessment & Plan Note (Signed)
Slightly smaller by CT scan compared with June while he has been on clarithromycin, rifampin, ethambutol.  It still not clear to me whether the abscess is going to fully resolve but he is a poor candidate for lung resection due to his obstructive lung disease.  I would like to restart him on antibiotics.  Consider Levaquin but I do not want him to develop a resistant Pseudomonas.  He has benefited from Augmentin in the past, does not have gram negatives on Gram stain so I will restart the Augmentin and follow him for several months.  He will need reimaging CT scan at that time

## 2016-11-05 NOTE — Assessment & Plan Note (Signed)
With an increase in his daily sputum production, cough, symptoms since his suppressive antibiotics were stopped.  I will treat with a prednisone taper.  Continue his same bronchodilators.

## 2016-11-05 NOTE — Patient Instructions (Addendum)
We will restart Augmentin 875mg  twice a day Take prednisone as directed until gone.  Continue Advair and Spiriva as you are taking them Follow with Dr Lamonte Sakai in 3 months or sooner if you have any problems.

## 2016-11-23 LAB — ACID FAST CULTURE WITH REFLEXED SENSITIVITIES (MYCOBACTERIA): Acid Fast Culture: NEGATIVE

## 2017-02-05 ENCOUNTER — Ambulatory Visit: Payer: BC Managed Care – PPO | Admitting: Emergency Medicine

## 2017-05-11 IMAGING — CT CT CHEST SUPER D W/O CM
2 of 4 series · 15 of 36 positions shown, 18 images · non-contrast
Comparison: The PET of 06/12/2015 and the diagnostic chest CT of
05/30/2015. 04/16/2015 radiographs also reviewed.

CLINICAL DATA: Lung mass.  Smoker.  COPD.  Emphysema.

EXAM:
CT CHEST WITHOUT CONTRAST
TECHNIQUE: Multidetector CT imaging of the chest was performed using thin slice
collimation for electromagnetic bronchoscopy planning purposes,
without intravenous contrast.

[Series 4: thins · axial · 0.81mm/px · z∈[+743,+1088]mm · 12 of 473 slices shown, 15 images]
[im 21/473  mediastinal]
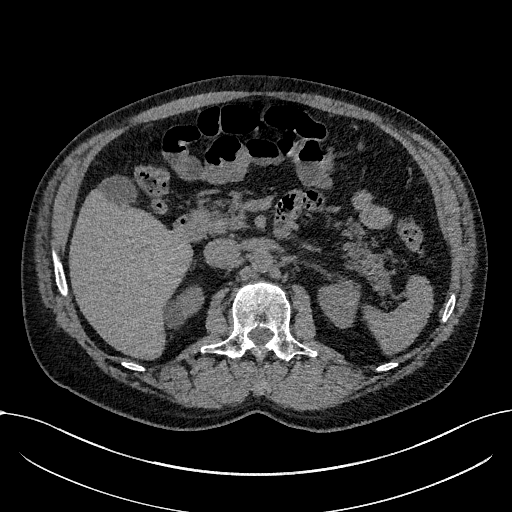
[im 21/473  lung]
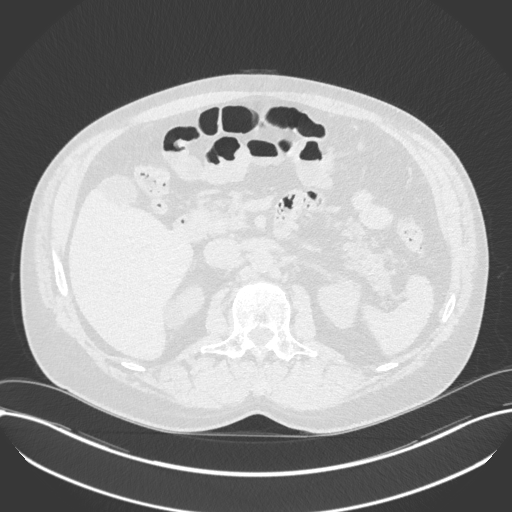
[im 62/473  lung]
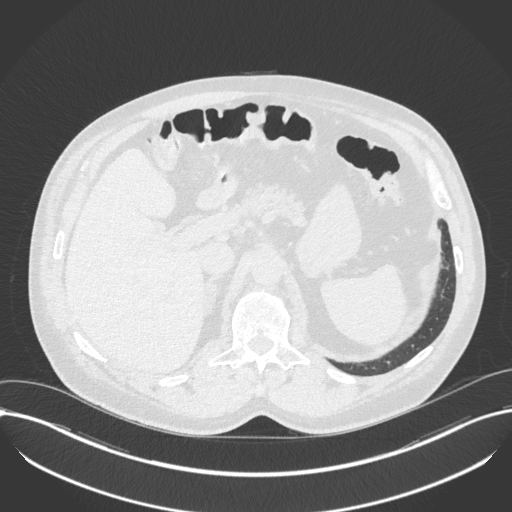
[im 103/473  lung]
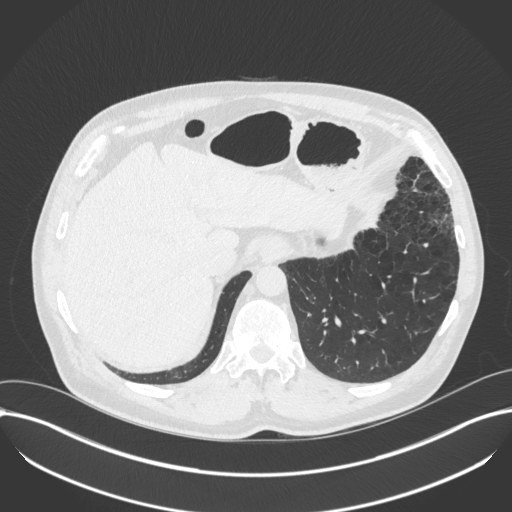
[im 144/473  lung]
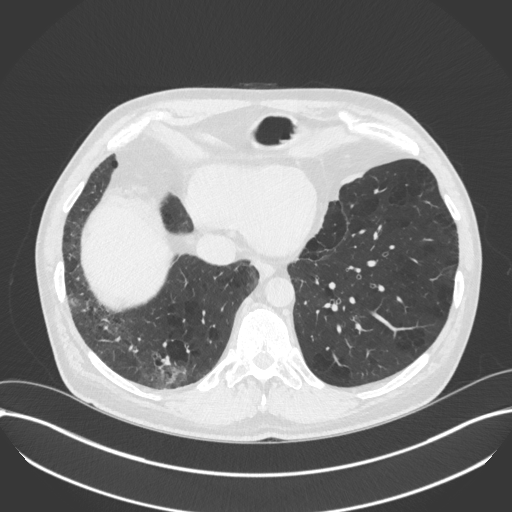
[im 185/473  mediastinal]
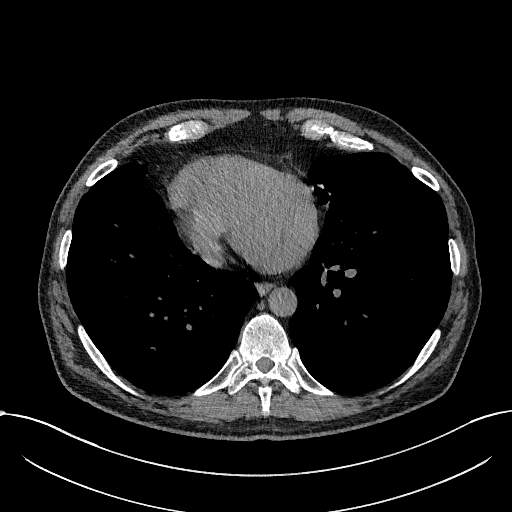
[im 185/473  lung]
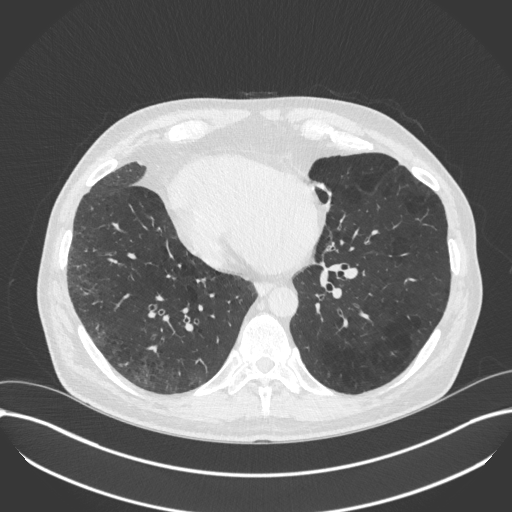
[im 226/473  lung]
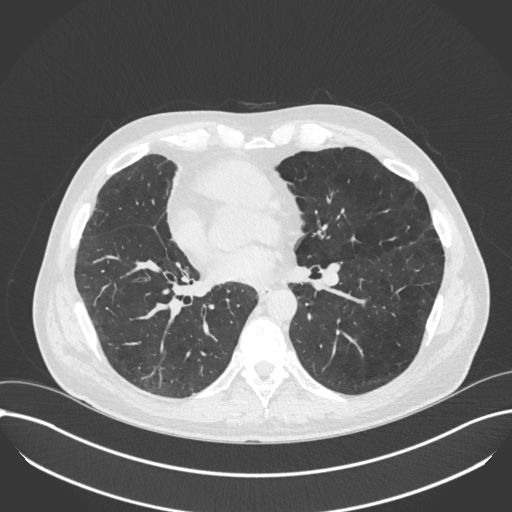
[im 247/473  lung]
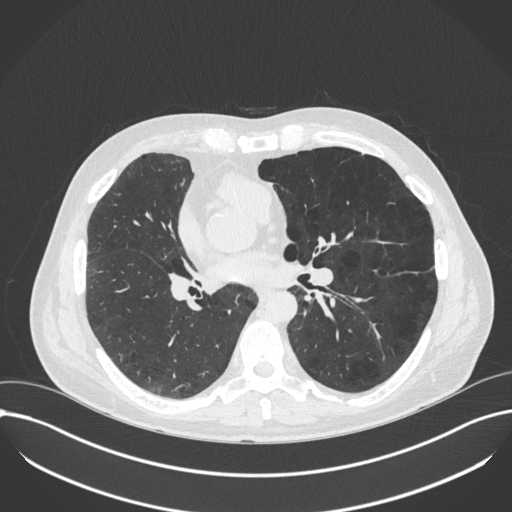
[im 288/473  lung]
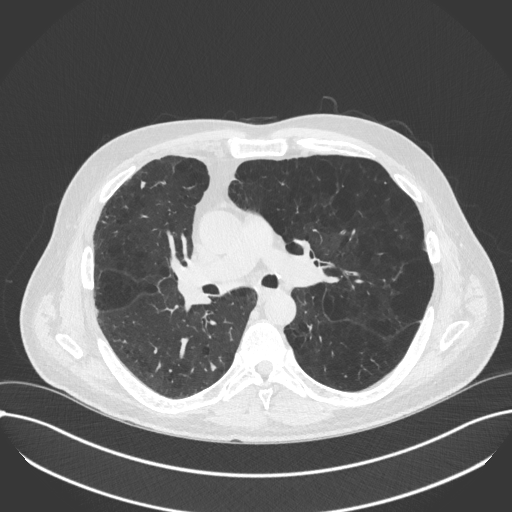
[im 329/473  mediastinal]
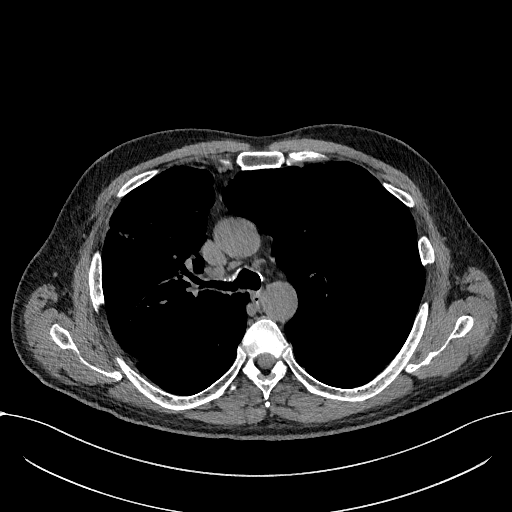
[im 329/473  lung]
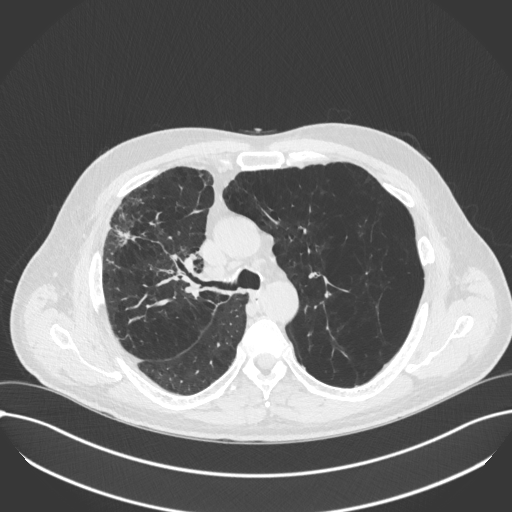
[im 370/473  lung]
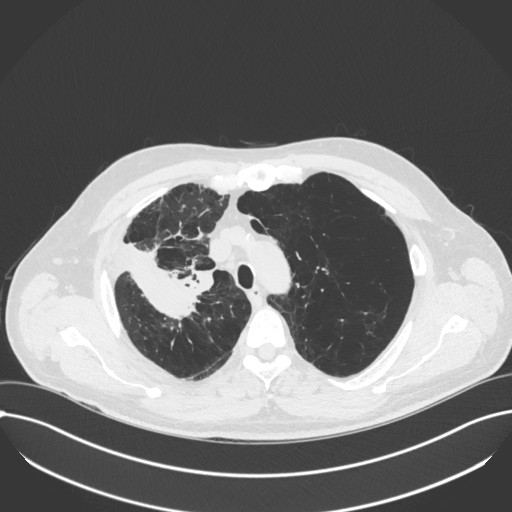
[im 411/473  lung]
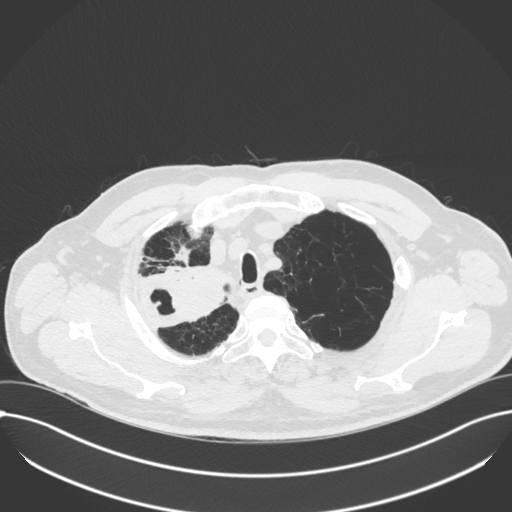
[im 452/473  lung]
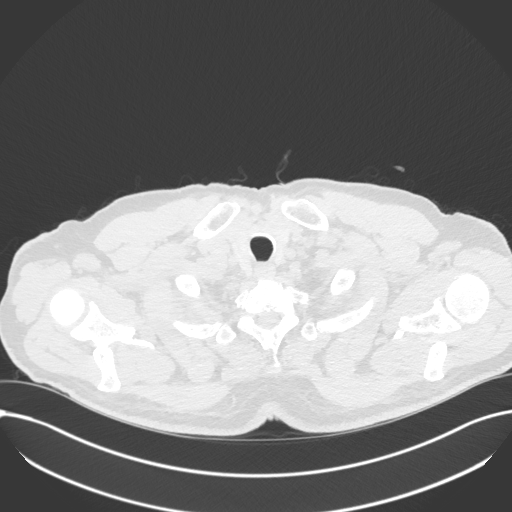

[Series 5: coronal · coronal · 0.74mm/px · 3 of 90 slices shown]
[im 18/90  lung]
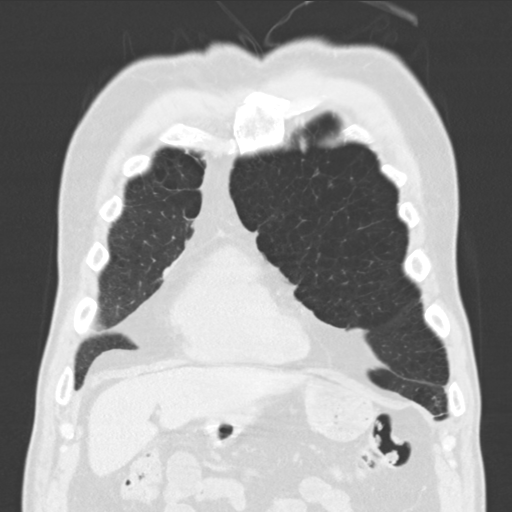
[im 36/90  lung]
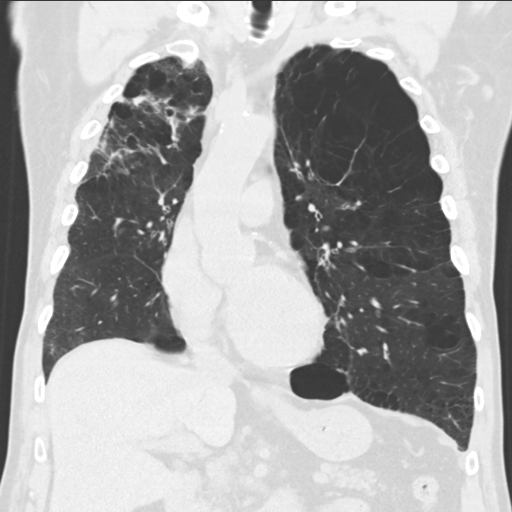
[im 54/90  lung]
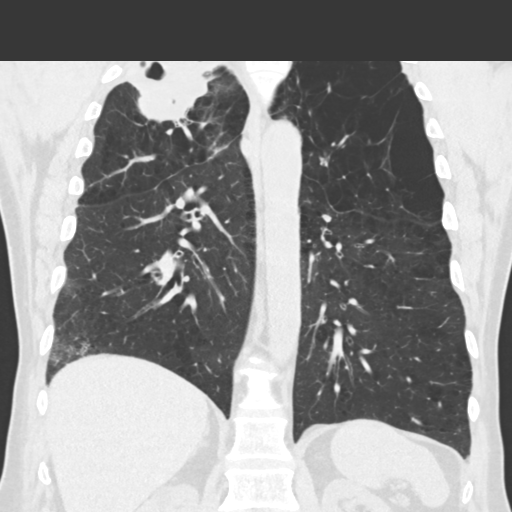

[15 of 36 positions shown; findings below may reference images not displayed]

FINDINGS: Cardiovascular: Aortic and branch vessel atherosclerosis. Tortuous
thoracic aorta. Normal heart size, without pericardial effusion.
Multivessel coronary artery atherosclerosis.

Mediastinum/Nodes: No mediastinal adenopathy. Hilar regions poorly
evaluated without intravenous contrast.

Lungs/Pleura: No pleural fluid. Advanced centrilobular emphysema.
Patent airways. Right middle lobe clustered nodules on image
33/series 3 are new or increased.

7 mm right lower lobe pulmonary nodule on image 52/ series 3 is
unchanged.

More medial right lower lobe 5 mm nodule on image 48/series 3 is
similar.

Right upper lobe masslike consolidation is again identified. In
greatest transverse dimension today, this measures on the order of
8.7 x 3.2 cm on image 17/series 3. At the same level on the prior,
this measured 11.0 x 3.0 cm (05/30/2015 CT). This is significantly
less masslike inferiorly, when comparing sagittal image 32 today to
sagittal image 36 previously. However, there is progressive
partially cavitary process/extension in the right apex, including at
4.6 by 6.1 cm on image 9/series 3.

Upper Abdomen: Normal imaged portions of the liver, spleen, stomach,
pancreas, adrenal glands, left kidney. Upper pole right renal cyst.

Musculoskeletal: No acute osseous abnormality. Degenerate disc
disease at the cervical thoracic junction.
IMPRESSION: 1. Shifting right upper lobe masslike consolidation. Although
inferior portion the process is significantly decreased, there is
new, partially cavitary right apical component. This remains most
suspicious for infection, including mycobacterial/ atypical
etiologies.
2. No thoracic adenopathy.
3.  Coronary artery atherosclerosis. Aortic atherosclerosis.
